# Patient Record
Sex: Female | Born: 1985 | Hispanic: No | Marital: Married | Smoking: Former smoker
Health system: Southern US, Community
[De-identification: ages and names within clinical notes are randomized; demographics above are authoritative.]

## PROBLEM LIST (undated history)

## (undated) ENCOUNTER — Inpatient Hospital Stay (HOSPITAL_COMMUNITY): Payer: Self-pay

## (undated) DIAGNOSIS — F329 Major depressive disorder, single episode, unspecified: Secondary | ICD-10-CM

## (undated) DIAGNOSIS — F909 Attention-deficit hyperactivity disorder, unspecified type: Secondary | ICD-10-CM

## (undated) DIAGNOSIS — R51 Headache: Secondary | ICD-10-CM

## (undated) DIAGNOSIS — F32A Depression, unspecified: Secondary | ICD-10-CM

## (undated) DIAGNOSIS — R519 Headache, unspecified: Secondary | ICD-10-CM

---
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
## 2008-08-29 ENCOUNTER — Ambulatory Visit (HOSPITAL_COMMUNITY): Admission: RE | Admit: 2008-08-29 | Discharge: 2008-08-29 | Payer: Self-pay | Admitting: Obstetrics and Gynecology

## 2010-10-08 LAB — CBC
MCHC: 33.8 g/dL (ref 30.0–36.0)
RBC: 4.76 MIL/uL (ref 3.87–5.11)
WBC: 7.4 10*3/uL (ref 4.0–10.5)
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
## 2013-02-26 ENCOUNTER — Emergency Department (HOSPITAL_COMMUNITY)
Admission: EM | Admit: 2013-02-26 | Discharge: 2013-02-26 | Disposition: A | Discharge status: Home / Self Care | Payer: No Typology Code available for payment source | Attending: Emergency Medicine | Admitting: Emergency Medicine

## 2013-02-26 ENCOUNTER — Encounter (HOSPITAL_COMMUNITY): Payer: Self-pay | Admitting: Nurse Practitioner

## 2013-02-26 DIAGNOSIS — O2390 Unspecified genitourinary tract infection in pregnancy, unspecified trimester: Secondary | ICD-10-CM | POA: Insufficient documentation

## 2013-02-26 DIAGNOSIS — Z8659 Personal history of other mental and behavioral disorders: Secondary | ICD-10-CM | POA: Insufficient documentation

## 2013-02-26 DIAGNOSIS — Z331 Pregnant state, incidental: Secondary | ICD-10-CM

## 2013-02-26 DIAGNOSIS — N390 Urinary tract infection, site not specified: Secondary | ICD-10-CM

## 2013-02-26 DIAGNOSIS — R51 Headache: Secondary | ICD-10-CM | POA: Insufficient documentation

## 2013-02-26 DIAGNOSIS — Z792 Long term (current) use of antibiotics: Secondary | ICD-10-CM | POA: Insufficient documentation

## 2013-02-26 DIAGNOSIS — E162 Hypoglycemia, unspecified: Secondary | ICD-10-CM | POA: Insufficient documentation

## 2013-02-26 DIAGNOSIS — Z79899 Other long term (current) drug therapy: Secondary | ICD-10-CM | POA: Insufficient documentation

## 2013-02-26 DIAGNOSIS — R404 Transient alteration of awareness: Secondary | ICD-10-CM | POA: Insufficient documentation

## 2013-02-26 DIAGNOSIS — Z87891 Personal history of nicotine dependence: Secondary | ICD-10-CM | POA: Insufficient documentation

## 2013-02-26 HISTORY — DX: Attention-deficit hyperactivity disorder, unspecified type: F90.9

## 2013-02-26 HISTORY — DX: Major depressive disorder, single episode, unspecified: F32.9

## 2013-02-26 HISTORY — DX: Depression, unspecified: F32.A

## 2013-02-26 HISTORY — DX: Headache: R51

## 2013-02-26 HISTORY — DX: Headache, unspecified: R51.9

## 2013-02-26 LAB — URINALYSIS, ROUTINE W REFLEX MICROSCOPIC
Glucose, UA: NEGATIVE mg/dL
Nitrite: NEGATIVE
Protein, ur: NEGATIVE mg/dL

## 2013-02-26 LAB — URINE MICROSCOPIC-ADD ON

## 2013-02-26 LAB — POCT I-STAT, CHEM 8
BUN: 5 mg/dL — ABNORMAL LOW (ref 6–23)
Chloride: 105 mEq/L (ref 96–112)
Creatinine, Ser: 0.7 mg/dL (ref 0.50–1.10)
Potassium: 4.2 mEq/L (ref 3.5–5.1)
Sodium: 137 mEq/L (ref 135–145)

## 2013-02-26 LAB — GLUCOSE, CAPILLARY: Glucose-Capillary: 82 mg/dL (ref 70–99)

## 2013-02-26 MED ORDER — ACETAMINOPHEN 325 MG PO TABS
650.0000 mg | ORAL_TABLET | Freq: Once | ORAL | Status: AC
  Administered 2013-02-26: 650 mg via ORAL
  Filled 2013-02-26: qty 2

## 2013-02-26 MED ORDER — CEPHALEXIN 500 MG PO CAPS: 500.0000 mg | ORAL_CAPSULE | Freq: Four times a day (QID) | ORAL | Status: DC

## 2013-02-26 NOTE — ED Notes (Signed)
cbg was 82 at 1502
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
## 2013-02-27 LAB — URINE CULTURE

## 2013-03-06 ENCOUNTER — Ambulatory Visit (INDEPENDENT_AMBULATORY_CARE_PROVIDER_SITE_OTHER): Payer: PRIVATE HEALTH INSURANCE | Admitting: Obstetrics & Gynecology

## 2013-03-06 ENCOUNTER — Encounter: Payer: Self-pay | Admitting: Obstetrics & Gynecology

## 2013-03-06 VITALS — BP 125/91 | Wt 176.2 lb

## 2013-03-06 DIAGNOSIS — Z3401 Encounter for supervision of normal first pregnancy, first trimester: Secondary | ICD-10-CM

## 2013-03-06 DIAGNOSIS — N898 Other specified noninflammatory disorders of vagina: Secondary | ICD-10-CM

## 2013-03-06 DIAGNOSIS — O99331 Smoking (tobacco) complicating pregnancy, first trimester: Secondary | ICD-10-CM | POA: Insufficient documentation

## 2013-03-06 DIAGNOSIS — O99330 Smoking (tobacco) complicating pregnancy, unspecified trimester: Secondary | ICD-10-CM

## 2013-03-06 LAB — POCT URINALYSIS DIP (DEVICE)
Protein, ur: NEGATIVE mg/dL
Specific Gravity, Urine: 1.02 (ref 1.005–1.030)
Urobilinogen, UA: 0.2 mg/dL (ref 0.0–1.0)
pH: 7.5 (ref 5.0–8.0)

## 2013-03-06 NOTE — Progress Notes (Signed)
Pt with c/o extreme fatigue.  Did not go to work yesterday because she was tired.  Wants note for work to sit down.   Past Medical History  Diagnosis Date  . ADHD (attention deficit hyperactivity disorder)   . Depression   . Headache      History reviewed. No pertinent past surgical history. No Known Allergies Current Outpatient Prescriptions on File Prior to Visit  Medication Sig Dispense Refill  . cephALEXin (KEFLEX) 500 MG capsule Take 1 capsule (500 mg total) by mouth 4 (four) times daily.  28 capsule  0  . Prenatal Vit-Fe Fumarate-FA (PRENATAL MULTIVITAMIN) TABS tablet Take 1 tablet by mouth daily at 12 noon.       No current facility-administered medications on file prior to visit.   History   Social History  . Marital Status: Single    Spouse Name: N/A    Number of Children: N/A  . Years of Education: N/A   Occupational History  . Not on file.   Social History Main Topics  . Smoking status: Current Every Day Smoker  . Smokeless tobacco: Never Used  . Alcohol Use: No  . Drug Use: No  . Sexual Activity: Yes    Birth Control/ Protection: None   Other Topics Concern  . Not on file   Social History Narrative  . No narrative on file   Positive tob prev 1 PPD now 2 cigs per day  PE: BP 125/91  Wt 176 lb 3.2 oz (79.924 kg)  BMI 28.45 kg/m2  LMP 12/17/2012 Abd; benign FHR 150's GU: EGBUS: no lesions Vagina: no blood in vault Cervix: no lesion; no mucopurulent d/c Uterus: gravid 12-14 weeks mobile Adnexa: no masses; sl tender   A/P 11 week IUP  PNL today rec tobacco cessation F/u 4 weeks D/w work in pregnancy

## 2013-03-06 NOTE — Addendum Note (Signed)
Addended by: Franchot Mimes on: 03/06/2013 11:27 AM   Modules accepted: Orders

## 2013-03-06 NOTE — Progress Notes (Signed)
Pulse: 83 1hr due at 945. Needs pap, never had one before.
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
## 2013-03-07 ENCOUNTER — Encounter: Payer: Self-pay | Admitting: General Practice

## 2013-03-07 ENCOUNTER — Other Ambulatory Visit: Payer: Self-pay | Admitting: Obstetrics & Gynecology

## 2013-03-07 DIAGNOSIS — B373 Candidiasis of vulva and vagina: Secondary | ICD-10-CM

## 2013-03-07 LAB — OBSTETRIC PANEL
Antibody Screen: NEGATIVE
Basophils Absolute: 0 10*3/uL (ref 0.0–0.1)
Basophils Relative: 0 % (ref 0–1)
Eosinophils Absolute: 0.1 10*3/uL (ref 0.0–0.7)
Hepatitis B Surface Ag: NEGATIVE
MCH: 31.2 pg (ref 26.0–34.0)
MCHC: 34.7 g/dL (ref 30.0–36.0)
Neutro Abs: 6.6 10*3/uL (ref 1.7–7.7)
Neutrophils Relative %: 76 % (ref 43–77)
RDW: 13.4 % (ref 11.5–15.5)

## 2013-03-07 LAB — GLUCOSE TOLERANCE, 1 HOUR (50G) W/O FASTING: Glucose, 1 Hour GTT: 54 mg/dL — ABNORMAL LOW (ref 70–140)

## 2013-03-07 LAB — WET PREP, GENITAL: Trich, Wet Prep: NONE SEEN

## 2013-03-07 MED ORDER — FLUCONAZOLE 150 MG PO TABS: 150.0000 mg | ORAL_TABLET | Freq: Once | ORAL | Status: DC

## 2013-03-07 NOTE — Progress Notes (Signed)
Called patient, no answer- left message that we are trying to get in touch with you, please call us back at the clinics 

## 2013-03-07 NOTE — Progress Notes (Signed)
Patient called back in to front desk and I informed her of results & medication available for pickup. Patient verbalized understanding and had no further questions

## 2013-03-08 ENCOUNTER — Encounter: Payer: Self-pay | Admitting: Obstetrics & Gynecology

## 2013-03-08 LAB — CULTURE, OB URINE: Colony Count: >=100000

## 2013-03-08 LAB — HEMOGLOBINOPATHY EVALUATION
Hemoglobin Other: 0 %
Hgb A2 Quant: 2.6 % (ref 2.2–3.2)
Hgb A: 97.4 % (ref 96.8–97.8)
Hgb S Quant: 0 %

## 2013-03-20 ENCOUNTER — Encounter (HOSPITAL_COMMUNITY): Payer: Self-pay

## 2013-03-20 ENCOUNTER — Emergency Department (HOSPITAL_COMMUNITY)
Admission: EM | Admit: 2013-03-20 | Discharge: 2013-03-20 | Disposition: A | Discharge status: Home / Self Care | Payer: No Typology Code available for payment source | Attending: Emergency Medicine | Admitting: Emergency Medicine

## 2013-03-20 DIAGNOSIS — R42 Dizziness and giddiness: Secondary | ICD-10-CM | POA: Insufficient documentation

## 2013-03-20 DIAGNOSIS — Z79899 Other long term (current) drug therapy: Secondary | ICD-10-CM | POA: Insufficient documentation

## 2013-03-20 DIAGNOSIS — G43909 Migraine, unspecified, not intractable, without status migrainosus: Secondary | ICD-10-CM | POA: Insufficient documentation

## 2013-03-20 DIAGNOSIS — Z8659 Personal history of other mental and behavioral disorders: Secondary | ICD-10-CM | POA: Insufficient documentation

## 2013-03-20 DIAGNOSIS — Z792 Long term (current) use of antibiotics: Secondary | ICD-10-CM | POA: Insufficient documentation

## 2013-03-20 DIAGNOSIS — F17200 Nicotine dependence, unspecified, uncomplicated: Secondary | ICD-10-CM | POA: Insufficient documentation

## 2013-03-20 LAB — URINALYSIS, ROUTINE W REFLEX MICROSCOPIC
Glucose, UA: NEGATIVE mg/dL
Ketones, ur: NEGATIVE mg/dL
Leukocytes, UA: NEGATIVE
Nitrite: NEGATIVE
Protein, ur: NEGATIVE mg/dL
Urobilinogen, UA: 0.2 mg/dL (ref 0.0–1.0)

## 2013-03-20 LAB — CBC
HCT: 37.2 % (ref 36.0–46.0)
Hemoglobin: 13.2 g/dL (ref 12.0–15.0)
MCV: 87.5 fL (ref 78.0–100.0)
Platelets: 275 10*3/uL (ref 150–400)
RBC: 4.25 MIL/uL (ref 3.87–5.11)
WBC: 8.5 10*3/uL (ref 4.0–10.5)

## 2013-03-20 LAB — BASIC METABOLIC PANEL
CO2: 24 mEq/L (ref 19–32)
Calcium: 9.4 mg/dL (ref 8.4–10.5)
Chloride: 102 mEq/L (ref 96–112)
Creatinine, Ser: 0.55 mg/dL (ref 0.50–1.10)
GFR calc Af Amer: >90 mL/min (ref >90)
GFR calc non Af Amer: >90 mL/min (ref >90)
Potassium: 3.7 mEq/L (ref 3.5–5.1)
Sodium: 135 mEq/L (ref 135–145)

## 2013-03-20 LAB — GLUCOSE, CAPILLARY: Glucose-Capillary: 76 mg/dL (ref 70–99)

## 2013-03-20 MED ORDER — SODIUM CHLORIDE 0.9 % IV BOLUS (SEPSIS)
1000.0000 mL | Freq: Once | INTRAVENOUS | Status: AC
  Administered 2013-03-20: 1000 mL via INTRAVENOUS

## 2013-03-20 MED ORDER — ACETAMINOPHEN 500 MG PO TABS
1000.0000 mg | ORAL_TABLET | Freq: Once | ORAL | Status: AC
  Administered 2013-03-20: 1000 mg via ORAL
  Filled 2013-03-20: qty 2

## 2013-03-20 MED ORDER — ONDANSETRON 4 MG PO TBDP
4.0000 mg | ORAL_TABLET | Freq: Once | ORAL | Status: AC
  Administered 2013-03-20: 4 mg via ORAL
  Filled 2013-03-20: qty 1

## 2013-03-20 NOTE — ED Notes (Signed)
Pt reports woke up around 0630 this morning with headache and dizziness.  Denies any n/v.

## 2013-03-20 NOTE — Discharge Instructions (Signed)
Migraine Headache °A migraine headache is an intense, throbbing pain on one or both sides of your head. A migraine can last for 30 minutes to several hours. °CAUSES  °The exact cause of a migraine headache is not always known. However, a migraine may be caused when nerves in the brain become irritated and release chemicals that cause inflammation. This causes pain. °SYMPTOMS °· Pain on one or both sides of your head. °· Pulsating or throbbing pain. °· Severe pain that prevents daily activities. °· Pain that is aggravated by any physical activity. °· Nausea, vomiting, or both. °· Dizziness. °· Pain with exposure to bright lights, loud noises, or activity. °· General sensitivity to bright lights, loud noises, or smells. °Before you get a migraine, you may get warning signs that a migraine is coming (aura). An aura may include: °· Seeing flashing lights. °· Seeing bright spots, halos, or zig-zag lines. °· Having tunnel vision or blurred vision. °· Having feelings of numbness or tingling. °· Having trouble talking. °· Having muscle weakness. °MIGRAINE TRIGGERS °· Alcohol. °· Smoking. °· Stress. °· Menstruation. °· Aged cheeses. °· Foods or drinks that contain nitrates, glutamate, aspartame, or tyramine. °· Lack of sleep. °· Chocolate. °· Caffeine. °· Hunger. °· Physical exertion. °· Fatigue. °· Medicines used to treat chest pain (nitroglycerine), birth control pills, estrogen, and some blood pressure medicines. °DIAGNOSIS  °A migraine headache is often diagnosed based on: °· Symptoms. °· Physical examination. °· A CT scan or MRI of your head. °TREATMENT °Medicines may be given for pain and nausea. Medicines can also be given to help prevent recurrent migraines.  °HOME CARE INSTRUCTIONS °· Only take over-the-counter or prescription medicines for pain or discomfort as directed by your caregiver. The use of long-term narcotics is not recommended. °· Lie down in a dark, quiet room when you have a migraine. °· Keep a journal  to find out what may trigger your migraine headaches. For example, write down: °· What you eat and drink. °· How much sleep you get. °· Any change to your diet or medicines. °· Limit alcohol consumption. °· Quit smoking if you smoke. °· Get 7 to 9 hours of sleep, or as recommended by your caregiver. °· Limit stress. °· Keep lights dim if bright lights bother you and make your migraines worse. °SEEK IMMEDIATE MEDICAL CARE IF:  °· Your migraine becomes severe. °· You have a fever. °· You have a stiff neck. °· You have vision loss. °· You have muscular weakness or loss of muscle control. °· You start losing your balance or have trouble walking. °· You feel faint or pass out. °· You have severe symptoms that are different from your first symptoms. °MAKE SURE YOU:  °· Understand these instructions. °· Will watch your condition. °· Will get help right away if you are not doing well or get worse. °Document Released: 06/14/2005 Document Revised: 09/06/2011 Document Reviewed: 06/04/2011 °Exit[employer]® Patient Information ©2014 [employer]. ° ° °RESOURCE GUIDE ° °Chronic Pain Problems: °Contact [facility]  [phone] °Patients need to be referred by their primary care doctor. ° °Insufficient Money for Medicine: °Contact [employer]:  call ([phone] 892-1162 ° °No Primary Care Doctor: °- Call [facility]  [phone] - can help you locate a primary care doctor that  accepts your insurance, provides certain services, etc. °- Physician Referral Service- 1-800-533-3463 ° °Agencies that provide inexpensive medical care: °- [facility]  [phone] °- [facility]  [phone] °- [facility]  [phone] °- [facility]  [phone] °- [facility]  [phone] °- [facility]  [phone] ° °Medicaid-accepting [place] Providers: °- [facility]- [address] ° [phone], Mon-Fri 9am-7pm, Sat 9am-1pm °- [facility]- [address], [address] ° [phone] °- [facility]- [address], [address] ° [phone] °- [facility]- [address] [place] [address] ° [phone] °- [facility]- [address], [address], [phone] ° Only accepts [place] Access Medicaid patients after they have their name  applied to their card ° °Self Pay (no insurance) in [place]: °- Sickle Cell Patients - [facility] ° [address], [phone] °- [facility]- [address] ° [phone] °      -     [facility]- [address] °      -     [facility]- see information above (Speak to Pam H if you do not have insurance) °      -  [facility] [place]- [address],  [phone] °      -  [facility]- [address] [place] [address], [phone] °      -  Dr Osei-Bonsu-  [address], [address], [place], [phone] °      -  [facility] - [address], [phone] °      -  [facility]- [address] [place] [address], [phone], also [address]  [address], [phone] °      -     [facility]- [address], [phone], 1st & 3rd Saturday °        every month, 10am-1pm ° -     [facility] °  [address], [place]. °  Phone:  [phone], Fax:  [phone]. Hours of Operation:  9 am - 6 pm, M-F. ° -     [facility] °  [address], [address], [place] °  Phone: [phone], Fax: [phone]. Hours of Operation:  8:30 am - 5:30 pm, M-F. ° ° ° °Dental Assistance °If unable to pay or uninsured, contact:  [place] [facility] to become qualified for the adult dental clinic. ° °Patients with Medicaid: [facility] °[address], [phone] °[address], [phone] ° °If unable to pay, or uninsured, contact [place] [facility] ([phone] in [place], [phone] in [place]) to become qualified  for the adult dental clinic ° °[facility] °[address] °[place], [place] [address] °([phone] °[email or web address] ° °Other Low-Cost Community Dental Services: °- [facility]- [address], [place], [place], [address], [phone], [phone], 2nd and 4th Thursday of the month at 6:30am.  10 clients each day by appointment, can sometimes see walk-in patients if someone does not show for an appointment. °- [facility]- [address], [place], [place], [address], [phone] °- [facility]- [address], [place], [place], [address], [phone] °- [facility]- [phone] °- [facility]- [phone] °- [facility]- [phone] ° ° ° ° ° °

## 2013-03-20 NOTE — ED Provider Notes (Signed)
This chart was scribed for Megan Maw Maryclaire Stoecker, DO by Dorothey Baseman, ED Scribe. This patient was seen in room APA05/APA05 and the patient's care was started at 8:50 AM.   TIME SEEN: 8:50 AM  CHIEF COMPLAINT: dizziness, headache  HPI:  HPI Comments: Megan Reese is a 27 y.o. Female G1P0 with a history of migraines who presents to the Emergency Department complaining of an intermittent, throbbing migraine to the center of the forehead onset last night and this morning with associated lightheadedness. She states that this migraine feels like her past episodes, but that it is more severe. She reports that the headache is exacerbated with photophobia, loud sounds, and anxiety. She reports her headache improved with Tylenol last night but she did not take any today. She denies fever, nausea, vomiting, numbness, weakness, or paresthesias, vaginal bleeding, or vaginal discharge. No bloody stools or melena. She reports taking Tylenol last night with mild, temporary relief. Patient is currently [redacted] weeks pregnant.    ROS: See HPI Constitutional: no fever  Eyes: no drainage  ENT: no runny nose   Cardiovascular:  no chest pain  Resp: no SOB  GI: no vomiting GU: no dysuria Integumentary: no rash  Allergy: no hives  Musculoskeletal: no leg swelling  Neurological: no slurred speech ROS otherwise negative  PAST MEDICAL HISTORY/PAST SURGICAL HISTORY:  Past Medical History  Diagnosis Date  . ADHD (attention deficit hyperactivity disorder)   . Depression   . Headache     MEDICATIONS:  Prior to Admission medications   Medication Sig Start Date End Date Taking? Authorizing Provider  cephALEXin (KEFLEX) 500 MG capsule Take 1 capsule (500 mg total) by mouth 4 (four) times daily. 02/26/13   Flint Melter, MD  fluconazole (DIFLUCAN) 150 MG tablet Take 1 tablet (150 mg total) by mouth once. 03/07/13   Willodean Rosenthal, MD  Prenatal Vit-Fe Fumarate-FA (PRENATAL MULTIVITAMIN) TABS tablet Take 1 tablet by  mouth daily at 12 noon.    Historical Provider, MD    ALLERGIES:  No Known Allergies  SOCIAL HISTORY:  History  Substance Use Topics  . Smoking status: Current Every Day Smoker  . Smokeless tobacco: Never Used  . Alcohol Use: No    FAMILY HISTORY: Family History  Problem Relation Age of Onset  . Diabetes Mother     EXAM:  Triage Vitals: BP 114/88  Pulse 96  Temp(Src) 98.3 F (36.8 C) (Oral)  SpO2 99%  LMP 12/17/2012  CONSTITUTIONAL: Alert and oriented and responds appropriately to questions. Well-appearing; well-nourished HEAD: Normocephalic EYES: Conjunctivae clear, PERRL ENT: normal nose; no rhinorrhea; moist mucous membranes; pharynx without lesions noted NECK: Supple, no meningismus, no LAD  CARD: RRR; S1 and S2 appreciated; no murmurs, no clicks, no rubs, no gallops RESP: Normal chest excursion without splinting or tachypnea; breath sounds clear and equal bilaterally; no wheezes, no rhonchi, no rales,  ABD/GI: Normal bowel sounds; non-distended; soft, non-tender, no rebound, no guarding BACK:  The back appears normal and is non-tender to palpation, there is no CVA tenderness EXT: Normal ROM in all joints; non-tender to palpation; no edema; normal capillary refill; no cyanosis    SKIN: Normal color for age and race; warm NEURO: Moves all extremities equally, strength is 5/5 to all 4 extremities, sensation to light touch intact diffusely. Cranial nerves 2-12 intact. No dysmetria to finger to nose testing bilaterally. PSYCH: The patient's mood and manner are appropriate. Grooming and personal hygiene are appropriate.  MEDICAL DECISION MAKING: 8:52AM- patient with typical migraine headache.  I am not concerned for intracranial hemorrhage, infarct, meningitis. Will order IV fluids and labs. Will order Tylenol and Zofran. Discussed treatment plan with patient at bedside and patient verbalized agreement.    ED PROGRESS: Patient reports feeling much better and her headache  is now gone. Labs, urinalysis unremarkable. We'll discharge home. Given return precautions. Given supportive care instructions. Patient verbalizes understanding is comfortable plan.   I personally performed the services described in this documentation, which was scribed in my presence. The recorded information has been reviewed and is accurate.   Megan Maw Megan Mairena, DO 03/20/13 1032

## 2013-03-21 NOTE — ED Notes (Signed)
Pt called and stated she was to sick to go to work today. Requested a work note for today and to have an Rx for nausea. EDP aware. Pt to have work note for today and an RX called in for Zofran 8 mg ODT 10 tabs

## 2013-03-23 ENCOUNTER — Telehealth: Payer: Self-pay | Admitting: *Deleted

## 2013-03-23 DIAGNOSIS — B373 Candidiasis of vulva and vagina: Secondary | ICD-10-CM

## 2013-03-23 MED ORDER — FLUCONAZOLE 150 MG PO TABS: 150.0000 mg | ORAL_TABLET | Freq: Once | ORAL | Status: DC

## 2013-03-23 NOTE — Telephone Encounter (Signed)
Megan Reese called and left a message stating she was seen recently and had yeast and took the medicine prescribed and it got better, but has come back. Wants to know if she can get a refill or does she need to be seen. Per chart wet prep done 03/05/13 and showed moderate yeast, given one dose diflucan.  Called Megan Reese and she states she has the same symptoms, white discharge, itching and burning. I informed her I can send in one more refill, but if that does not relieve her symptoms she needs to come in for eval as it could be something else. Also noted patient has next ob visit early October.  Patient voices understanding

## 2013-03-28 ENCOUNTER — Encounter: Payer: PRIVATE HEALTH INSURANCE | Admitting: Advanced Practice Midwife

## 2013-04-03 ENCOUNTER — Encounter: Payer: Self-pay | Admitting: Family Medicine

## 2013-04-03 ENCOUNTER — Ambulatory Visit (INDEPENDENT_AMBULATORY_CARE_PROVIDER_SITE_OTHER): Payer: No Typology Code available for payment source | Admitting: Family Medicine

## 2013-04-03 VITALS — BP 119/77 | Temp 97.7°F

## 2013-04-03 DIAGNOSIS — Z3480 Encounter for supervision of other normal pregnancy, unspecified trimester: Secondary | ICD-10-CM

## 2013-04-03 DIAGNOSIS — Z23 Encounter for immunization: Secondary | ICD-10-CM

## 2013-04-03 DIAGNOSIS — O99331 Smoking (tobacco) complicating pregnancy, first trimester: Secondary | ICD-10-CM

## 2013-04-03 DIAGNOSIS — Z3492 Encounter for supervision of normal pregnancy, unspecified, second trimester: Secondary | ICD-10-CM

## 2013-04-03 DIAGNOSIS — O99330 Smoking (tobacco) complicating pregnancy, unspecified trimester: Secondary | ICD-10-CM

## 2013-04-03 LAB — POCT URINALYSIS DIP (DEVICE)
Bilirubin Urine: NEGATIVE
Glucose, UA: NEGATIVE mg/dL
Ketones, ur: NEGATIVE mg/dL

## 2013-04-03 NOTE — Addendum Note (Signed)
Addended by: Jill Side on: 04/03/2013 03:18 PM   Modules accepted: Orders

## 2013-04-03 NOTE — Addendum Note (Signed)
Addended by: Franchot Mimes on: 04/03/2013 03:10 PM   Modules accepted: Orders

## 2013-04-03 NOTE — Patient Instructions (Addendum)
Pregnancy - Second Trimester The second trimester of pregnancy (3 to 6 months) is a period of rapid growth for you and your baby. At the end of the sixth month, your baby is about 9 inches long and weighs 1 1/2 pounds. You will begin to feel the baby move between 18 and 20 weeks of the pregnancy. This is called quickening. Weight gain is faster. A clear fluid (colostrum) may leak out of your breasts. You may feel small contractions of the womb (uterus). This is known as false labor or Braxton-Hicks contractions. This is like a practice for labor when the baby is ready to be born. Usually, the problems with morning sickness have usually passed by the end of your first trimester. Some women develop small dark blotches (called cholasma, mask of pregnancy) on their face that usually goes away after the baby is born. Exposure to the sun makes the blotches worse. Acne may also develop in some pregnant women and pregnant women who have acne, may find that it goes away. PRENATAL EXAMS  Blood work may continue to be done during prenatal exams. These tests are done to check on your health and the probable health of your baby. Blood work is used to follow your blood levels (hemoglobin). Anemia (low hemoglobin) is common during pregnancy. Iron and vitamins are given to help prevent this. You will also be checked for diabetes between 24 and 28 weeks of the pregnancy. Some of the previous blood tests may be repeated.  The size of the uterus is measured during each visit. This is to make sure that the baby is continuing to grow properly according to the dates of the pregnancy.  Your blood pressure is checked every prenatal visit. This is to make sure you are not getting toxemia.  Your urine is checked to make sure you do not have an infection, diabetes or protein in the urine.  Your weight is checked often to make sure gains are happening at the suggested rate. This is to ensure that both you and your baby are growing  normally.  Sometimes, an ultrasound is performed to confirm the proper growth and development of the baby. This is a test which bounces harmless sound waves off the baby so your caregiver can more accurately determine due dates. Sometimes, a test is done on the amniotic fluid surrounding the baby. This test is called an amniocentesis. The amniotic fluid is obtained by sticking a needle into the belly (abdomen). This is done to check the chromosomes in instances where there is a concern about possible genetic problems with the baby. It is also sometimes done near the end of pregnancy if an early delivery is required. In this case, it is done to help make sure the baby's lungs are mature enough for the baby to live outside of the womb. CHANGES OCCURING IN THE SECOND TRIMESTER OF PREGNANCY Your body goes through many changes during pregnancy. They vary from person to person. Talk to your caregiver about changes you notice that you are concerned about.  During the second trimester, you will likely have an increase in your appetite. It is normal to have cravings for certain foods. This varies from person to person and pregnancy to pregnancy.  Your lower abdomen will begin to bulge.  You may have to urinate more often because the uterus and baby are pressing on your bladder. It is also common to get more bladder infections during pregnancy. You can help this by drinking lots of fluids  and emptying your bladder before and after intercourse.  You may begin to get stretch marks on your hips, abdomen, and breasts. These are normal changes in the body during pregnancy. There are no exercises or medicines to take that prevent this change.  You may begin to develop swollen and bulging veins (varicose veins) in your legs. Wearing support hose, elevating your feet for 15 minutes, 3 to 4 times a day and limiting salt in your diet helps lessen the problem.  Heartburn may develop as the uterus grows and pushes up  against the stomach. Antacids recommended by your caregiver helps with this problem. Also, eating smaller meals 4 to 5 times a day helps.  Constipation can be treated with a stool softener or adding bulk to your diet. Drinking lots of fluids, and eating vegetables, fruits, and whole grains are helpful.  Exercising is also helpful. If you have been very active up until your pregnancy, most of these activities can be continued during your pregnancy. If you have been less active, it is helpful to start an exercise program such as walking.  Hemorrhoids may develop at the end of the second trimester. Warm sitz baths and hemorrhoid cream recommended by your caregiver helps hemorrhoid problems.  Backaches may develop during this time of your pregnancy. Avoid heavy lifting, wear low heal shoes, and practice good posture to help with backache problems.  Some pregnant women develop tingling and numbness of their hand and fingers because of swelling and tightening of ligaments in the wrist (carpel tunnel syndrome). This goes away after the baby is born.  As your breasts enlarge, you may have to get a bigger bra. Get a comfortable, cotton, support bra. Do not get a nursing bra until the last month of the pregnancy if you will be nursing the baby.  You may get a dark line from your belly button to the pubic area called the linea nigra.  You may develop rosy cheeks because of increase blood flow to the face.  You may develop spider looking lines of the face, neck, arms, and chest. These go away after the baby is born. HOME CARE INSTRUCTIONS   It is extremely important to avoid all smoking, herbs, alcohol, and unprescribed drugs during your pregnancy. These chemicals affect the formation and growth of the baby. Avoid these chemicals throughout the pregnancy to ensure the delivery of a healthy infant.  Most of your home care instructions are the same as suggested for the first trimester of your pregnancy.  Keep your caregiver's appointments. Follow your caregiver's instructions regarding medicine use, exercise, and diet.  During pregnancy, you are providing food for you and your baby. Continue to eat regular, well-balanced meals. Choose foods such as meat, fish, milk and other low fat dairy products, vegetables, fruits, and whole-grain breads and cereals. Your caregiver will tell you of the ideal weight gain.  A physical sexual relationship may be continued up until near the end of pregnancy if there are no other problems. Problems could include early (premature) leaking of amniotic fluid from the membranes, vaginal bleeding, abdominal pain, or other medical or pregnancy problems.  Exercise regularly if there are no restrictions. Check with your caregiver if you are unsure of the safety of some of your exercises. The greatest weight gain will occur in the last 2 trimesters of pregnancy. Exercise will help you:  Control your weight.  Get you in shape for labor and delivery.  Lose weight after you have the baby.  Wear  a good support or jogging bra for breast tenderness during pregnancy. This may help if worn during sleep. Pads or tissues may be used in the bra if you are leaking colostrum.  Do not use hot tubs, steam rooms or saunas throughout the pregnancy.  Wear your seat belt at all times when driving. This protects you and your baby if you are in an accident.  Avoid raw meat, uncooked cheese, cat litter boxes, and soil used by cats. These carry germs that can cause birth defects in the baby.  The second trimester is also a good time to visit your dentist for your dental health if this has not been done yet. Getting your teeth cleaned is okay. Use a soft toothbrush. Brush gently during pregnancy.  It is easier to leak urine during pregnancy. Tightening up and strengthening the pelvic muscles will help with this problem. Practice stopping your urination while you are going to the bathroom.  These are the same muscles you need to strengthen. It is also the muscles you would use as if you were trying to stop from passing gas. You can practice tightening these muscles up 10 times a set and repeating this about 3 times per day. Once you know what muscles to tighten up, do not perform these exercises during urination. It is more likely to contribute to an infection by backing up the urine.  Ask for help if you have financial, counseling, or nutritional needs during pregnancy. Your caregiver will be able to offer counseling for these needs as well as refer you for other special needs.  Your skin may become oily. If so, wash your face with mild soap, use non-greasy moisturizer and oil or cream based makeup. MEDICINES AND DRUG USE IN PREGNANCY  Take prenatal vitamins as directed. The vitamin should contain 1 milligram of folic acid. Keep all vitamins out of reach of children. Only a couple vitamins or tablets containing iron may be fatal to a baby or young child when ingested.  Avoid use of all medicines, including herbs, over-the-counter medicines, not prescribed or suggested by your caregiver. Only take over-the-counter or prescription medicines for pain, discomfort, or fever as directed by your caregiver. Do not use aspirin.  Let your caregiver also know about herbs you may be using.  Alcohol is related to a number of birth defects. This includes fetal alcohol syndrome. All alcohol, in any form, should be avoided completely. Smoking will cause low birth rate and premature babies.  Street or illegal drugs are very harmful to the baby. They are absolutely forbidden. A baby born to an addicted mother will be addicted at birth. The baby will go through the same withdrawal an adult does. SEEK MEDICAL CARE IF:  You have any concerns or worries during your pregnancy. It is better to call with your questions if you feel they cannot wait, rather than worry about them. SEEK IMMEDIATE MEDICAL CARE  IF:   An unexplained oral temperature above 102 F (38.9 C) develops, or as your caregiver suggests.  You have leaking of fluid from the vagina (birth canal). If leaking membranes are suspected, take your temperature and tell your caregiver of this when you call.  There is vaginal spotting, bleeding, or passing clots. Tell your caregiver of the amount and how many pads are used. Light spotting in pregnancy is common, especially following intercourse.  You develop a bad smelling vaginal discharge with a change in the color from clear to white.  You continue to feel  sick to your stomach (nauseated) and have no relief from remedies suggested. You vomit blood or coffee ground-like materials.  You lose more than 2 pounds of weight or gain more than 2 pounds of weight over 1 week, or as suggested by your caregiver.  You notice swelling of your face, hands, feet, or legs.  You get exposed to [place] measles and have never had them.  You are exposed to fifth disease or chickenpox.  You develop belly (abdominal) pain. Round ligament discomfort is a common non-cancerous (benign) cause of abdominal pain in pregnancy. Your caregiver still must evaluate you.  You develop a bad headache that does not go away.  You develop fever, diarrhea, pain with urination, or shortness of breath.  You develop visual problems, blurry, or double vision.  You fall or are in a car accident or any kind of trauma.  There is mental or physical violence at home. Document Released: 06/08/2001 Document Revised: 03/08/2012 Document Reviewed: 12/11/2008 [facility] Patient Information 2014 [place], [place].  Pregnancy - Second Trimester The second trimester of pregnancy (3 to 6 months) is a period of rapid growth for you and your baby. At the end of the sixth month, your baby is about 9 inches long and weighs 1 1/2 pounds. You will begin to feel the baby move between 18 and 20 weeks of the pregnancy. This is called  quickening. Weight gain is faster. A clear fluid (colostrum) may leak out of your breasts. You may feel small contractions of the womb (uterus). This is known as false labor or Braxton-Hicks contractions. This is like a practice for labor when the baby is ready to be born. Usually, the problems with morning sickness have usually passed by the end of your first trimester. Some women develop small dark blotches (called cholasma, mask of pregnancy) on their face that usually goes away after the baby is born. Exposure to the sun makes the blotches worse. Acne may also develop in some pregnant women and pregnant women who have acne, may find that it goes away. PRENATAL EXAMS  Blood work may continue to be done during prenatal exams. These tests are done to check on your health and the probable health of your baby. Blood work is used to follow your blood levels (hemoglobin). Anemia (low hemoglobin) is common during pregnancy. Iron and vitamins are given to help prevent this. You will also be checked for diabetes between 24 and 28 weeks of the pregnancy. Some of the previous blood tests may be repeated.  The size of the uterus is measured during each visit. This is to make sure that the baby is continuing to grow properly according to the dates of the pregnancy.  Your blood pressure is checked every prenatal visit. This is to make sure you are not getting toxemia.  Your urine is checked to make sure you do not have an infection, diabetes or protein in the urine.  Your weight is checked often to make sure gains are happening at the suggested rate. This is to ensure that both you and your baby are growing normally.  Sometimes, an ultrasound is performed to confirm the proper growth and development of the baby. This is a test which bounces harmless sound waves off the baby so your caregiver can more accurately determine due dates. Sometimes, a test is done on the amniotic fluid surrounding the baby. This test is  called an amniocentesis. The amniotic fluid is obtained by sticking a needle into the belly (abdomen). This  is done to check the chromosomes in instances where there is a concern about possible genetic problems with the baby. It is also sometimes done near the end of pregnancy if an early delivery is required. In this case, it is done to help make sure the baby's lungs are mature enough for the baby to live outside of the womb. CHANGES OCCURING IN THE SECOND TRIMESTER OF PREGNANCY Your body goes through many changes during pregnancy. They vary from person to person. Talk to your caregiver about changes you notice that you are concerned about.  During the second trimester, you will likely have an increase in your appetite. It is normal to have cravings for certain foods. This varies from person to person and pregnancy to pregnancy.  Your lower abdomen will begin to bulge.  You may have to urinate more often because the uterus and baby are pressing on your bladder. It is also common to get more bladder infections during pregnancy. You can help this by drinking lots of fluids and emptying your bladder before and after intercourse.  You may begin to get stretch marks on your hips, abdomen, and breasts. These are normal changes in the body during pregnancy. There are no exercises or medicines to take that prevent this change.  You may begin to develop swollen and bulging veins (varicose veins) in your legs. Wearing support hose, elevating your feet for 15 minutes, 3 to 4 times a day and limiting salt in your diet helps lessen the problem.  Heartburn may develop as the uterus grows and pushes up against the stomach. Antacids recommended by your caregiver helps with this problem. Also, eating smaller meals 4 to 5 times a day helps.  Constipation can be treated with a stool softener or adding bulk to your diet. Drinking lots of fluids, and eating vegetables, fruits, and whole grains are  helpful.  Exercising is also helpful. If you have been very active up until your pregnancy, most of these activities can be continued during your pregnancy. If you have been less active, it is helpful to start an exercise program such as walking.  Hemorrhoids may develop at the end of the second trimester. Warm sitz baths and hemorrhoid cream recommended by your caregiver helps hemorrhoid problems.  Backaches may develop during this time of your pregnancy. Avoid heavy lifting, wear low heal shoes, and practice good posture to help with backache problems.  Some pregnant women develop tingling and numbness of their hand and fingers because of swelling and tightening of ligaments in the wrist (carpel tunnel syndrome). This goes away after the baby is born.  As your breasts enlarge, you may have to get a bigger bra. Get a comfortable, cotton, support bra. Do not get a nursing bra until the last month of the pregnancy if you will be nursing the baby.  You may get a dark line from your belly button to the pubic area called the linea nigra.  You may develop rosy cheeks because of increase blood flow to the face.  You may develop spider looking lines of the face, neck, arms, and chest. These go away after the baby is born. HOME CARE INSTRUCTIONS   It is extremely important to avoid all smoking, herbs, alcohol, and unprescribed drugs during your pregnancy. These chemicals affect the formation and growth of the baby. Avoid these chemicals throughout the pregnancy to ensure the delivery of a healthy infant.  Most of your home care instructions are the same as suggested for the  first trimester of your pregnancy. Keep your caregiver's appointments. Follow your caregiver's instructions regarding medicine use, exercise, and diet.  During pregnancy, you are providing food for you and your baby. Continue to eat regular, well-balanced meals. Choose foods such as meat, fish, milk and other low fat dairy  products, vegetables, fruits, and whole-grain breads and cereals. Your caregiver will tell you of the ideal weight gain.  A physical sexual relationship may be continued up until near the end of pregnancy if there are no other problems. Problems could include early (premature) leaking of amniotic fluid from the membranes, vaginal bleeding, abdominal pain, or other medical or pregnancy problems.  Exercise regularly if there are no restrictions. Check with your caregiver if you are unsure of the safety of some of your exercises. The greatest weight gain will occur in the last 2 trimesters of pregnancy. Exercise will help you:  Control your weight.  Get you in shape for labor and delivery.  Lose weight after you have the baby.  Wear a good support or jogging bra for breast tenderness during pregnancy. This may help if worn during sleep. Pads or tissues may be used in the bra if you are leaking colostrum.  Do not use hot tubs, steam rooms or saunas throughout the pregnancy.  Wear your seat belt at all times when driving. This protects you and your baby if you are in an accident.  Avoid raw meat, uncooked cheese, cat litter boxes, and soil used by cats. These carry germs that can cause birth defects in the baby.  The second trimester is also a good time to visit your dentist for your dental health if this has not been done yet. Getting your teeth cleaned is okay. Use a soft toothbrush. Brush gently during pregnancy.  It is easier to leak urine during pregnancy. Tightening up and strengthening the pelvic muscles will help with this problem. Practice stopping your urination while you are going to the bathroom. These are the same muscles you need to strengthen. It is also the muscles you would use as if you were trying to stop from passing gas. You can practice tightening these muscles up 10 times a set and repeating this about 3 times per day. Once you know what muscles to tighten up, do not perform  these exercises during urination. It is more likely to contribute to an infection by backing up the urine.  Ask for help if you have financial, counseling, or nutritional needs during pregnancy. Your caregiver will be able to offer counseling for these needs as well as refer you for other special needs.  Your skin may become oily. If so, wash your face with mild soap, use non-greasy moisturizer and oil or cream based makeup. MEDICINES AND DRUG USE IN PREGNANCY  Take prenatal vitamins as directed. The vitamin should contain 1 milligram of folic acid. Keep all vitamins out of reach of children. Only a couple vitamins or tablets containing iron may be fatal to a baby or young child when ingested.  Avoid use of all medicines, including herbs, over-the-counter medicines, not prescribed or suggested by your caregiver. Only take over-the-counter or prescription medicines for pain, discomfort, or fever as directed by your caregiver. Do not use aspirin.  Let your caregiver also know about herbs you may be using.  Alcohol is related to a number of birth defects. This includes fetal alcohol syndrome. All alcohol, in any form, should be avoided completely. Smoking will cause low birth rate and premature babies.  Street or illegal drugs are very harmful to the baby. They are absolutely forbidden. A baby born to an addicted mother will be addicted at birth. The baby will go through the same withdrawal an adult does. SEEK MEDICAL CARE IF:  You have any concerns or worries during your pregnancy. It is better to call with your questions if you feel they cannot wait, rather than worry about them. SEEK IMMEDIATE MEDICAL CARE IF:   An unexplained oral temperature above 102 F (38.9 C) develops, or as your caregiver suggests.  You have leaking of fluid from the vagina (birth canal). If leaking membranes are suspected, take your temperature and tell your caregiver of this when you call.  There is vaginal  spotting, bleeding, or passing clots. Tell your caregiver of the amount and how many pads are used. Light spotting in pregnancy is common, especially following intercourse.  You develop a bad smelling vaginal discharge with a change in the color from clear to white.  You continue to feel sick to your stomach (nauseated) and have no relief from remedies suggested. You vomit blood or coffee ground-like materials.  You lose more than 2 pounds of weight or gain more than 2 pounds of weight over 1 week, or as suggested by your caregiver.  You notice swelling of your face, hands, feet, or legs.  You get exposed to [place] measles and have never had them.  You are exposed to fifth disease or chickenpox.  You develop belly (abdominal) pain. Round ligament discomfort is a common non-cancerous (benign) cause of abdominal pain in pregnancy. Your caregiver still must evaluate you.  You develop a bad headache that does not go away.  You develop fever, diarrhea, pain with urination, or shortness of breath.  You develop visual problems, blurry, or double vision.  You fall or are in a car accident or any kind of trauma.  There is mental or physical violence at home. Document Released: 06/08/2001 Document Revised: 03/08/2012 Document Reviewed: 12/11/2008 [facility] Patient Information 2014 [place], [place]. Place 10-18 weeks prenatal visit patient instructions here.

## 2013-04-03 NOTE — Progress Notes (Signed)
P = 65   Pt requests [place] today.  Pt reports no further problems from vaginal yeast since last Rx.  Pt states that she has been nauseous and is not taking her PNV's.

## 2013-04-03 NOTE — Progress Notes (Signed)
   Subjective:    Mahsa Maple is a 27 y.o. female being seen today for her obstetrical visit. She is at [redacted]w[redacted]d gestation. Patient reports no bleeding, no contractions, no cramping and no leaking. Fetal movement: normal.  Menstrual History: OB History   Grav Para Term Preterm Abortions TAB SAB Ect Mult Living   1                Patient's last menstrual period was 12/17/2012.    The following portions of the patient's history were reviewed and updated as appropriate: allergies, current medications, past family history, past medical history, past social history, past surgical history and problem list.  Review of Systems Pertinent items are noted in HPI.   Objective:     BP 119/77  Temp(Src) 97.7 F (36.5 C)  LMP 12/17/2012 Uterine Size: size equals dates    FHT: 150s  Assessment:   Adamary Handley is a 27 y.o. G1P0 at [redacted]w[redacted]d by LMP presents for ROB  Discussed with Patient:  - ordered genetics screen (Quad screen/anatomy scan) - Patient plans on breast  feeding. - Routine precautions discussed (depression, infection s/s).   Patient provided with all pertinent phone numbers for emergencies. - RTC for any VB, regular, painful cramps/ctxs occurring at a rate of >2/10 min, fever (100.5 or higher), n/v/d, any pain that is unresolving or worsening. - RTC in 4 weeks for next appt.  Problems: Patient Active Problem List   Diagnosis Date Noted  . 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 20 week anatomy scan ordered.  Patient to schedule. 2. Quad screen  [ ]  Vaccines: Flu: 7Oct Tdap:  [ ]  BCM: undecided  Edu: [ x] PTL precautions; [ ]  BF class; [ ]  childbirth class; [ ]   BF counseling

## 2013-04-04 LAB — CULTURE, OB URINE

## 2013-04-08 NOTE — Addendum Note (Signed)
Addended by: Minta Balsam on: 04/08/2013 05:04 PM   Modules accepted: Orders

## 2013-05-01 ENCOUNTER — Ambulatory Visit (INDEPENDENT_AMBULATORY_CARE_PROVIDER_SITE_OTHER): Payer: No Typology Code available for payment source | Admitting: Family Medicine

## 2013-05-01 ENCOUNTER — Other Ambulatory Visit: Payer: Self-pay | Admitting: Family Medicine

## 2013-05-01 ENCOUNTER — Encounter: Payer: Self-pay | Admitting: Family Medicine

## 2013-05-01 ENCOUNTER — Ambulatory Visit (HOSPITAL_COMMUNITY)
Admission: RE | Admit: 2013-05-01 | Discharge: 2013-05-01 | Disposition: A | Discharge status: Home / Self Care | Payer: No Typology Code available for payment source | Source: Ambulatory Visit | Attending: Family Medicine | Admitting: Family Medicine

## 2013-05-01 ENCOUNTER — Encounter (HOSPITAL_COMMUNITY): Payer: Self-pay

## 2013-05-01 VITALS — BP 119/81 | Temp 97.2°F | Wt 181.0 lb

## 2013-05-01 DIAGNOSIS — O280 Abnormal hematological finding on antenatal screening of mother: Secondary | ICD-10-CM

## 2013-05-01 DIAGNOSIS — Z3492 Encounter for supervision of normal pregnancy, unspecified, second trimester: Secondary | ICD-10-CM

## 2013-05-01 DIAGNOSIS — Z363 Encounter for antenatal screening for malformations: Secondary | ICD-10-CM | POA: Insufficient documentation

## 2013-05-01 DIAGNOSIS — O358XX0 Maternal care for other (suspected) fetal abnormality and damage, not applicable or unspecified: Secondary | ICD-10-CM | POA: Insufficient documentation

## 2013-05-01 DIAGNOSIS — O289 Unspecified abnormal findings on antenatal screening of mother: Secondary | ICD-10-CM

## 2013-05-01 DIAGNOSIS — Z1389 Encounter for screening for other disorder: Secondary | ICD-10-CM | POA: Insufficient documentation

## 2013-05-01 DIAGNOSIS — O99330 Smoking (tobacco) complicating pregnancy, unspecified trimester: Secondary | ICD-10-CM

## 2013-05-01 NOTE — Progress Notes (Signed)
Pulse- 74  Edema-feet

## 2013-05-01 NOTE — Progress Notes (Signed)
+  fm, No lof, no vb, no ctx  +quad screen for downs, redated with anatomy scan, pt initially to f/u with MFM tomorrow, pt will have to reschedule 2/2 to schedule. Will reevaluate quad with new dates for risk of downs and will defer to MFM regarding amniocentesis vs harmony screening.pt states understanding.  Megan Reese is a 27 y.o. G1P0 at [redacted]w[redacted]d presents for ROB  Discussed with Patient:  - Reviewed genetics screen Visual merchandiser screen).   - Patient plans on breast feeding. - Routine precautions discussed (depression, infection s/s).   Patient provided with all pertinent phone numbers for emergencies. - RTC for any VB, regular, painful cramps/ctxs occurring at a rate of >2/10 min, fever (100.5 or higher), n/v/d, any pain that is unresolving or worsening. - RTC in 2 weeks for next appt.  Problems: Patient Active Problem List   Diagnosis Date Noted  . 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 F/u 2 wks to discuss results from MFM  [ ]  Vaccines: Flu: recd Tdap:

## 2013-05-01 NOTE — Progress Notes (Signed)
Anatomy US done today. Dates were changed, so Quad Screen, which was abnormal for [facility], will be recalculated. Has appt in MFM tomorrow to discuss further.

## 2013-05-01 NOTE — Patient Instructions (Signed)
Pregnancy - Second Trimester The second trimester is the period between 13 to 27 weeks of your pregnancy. It is important to follow your doctor's instructions. HOME CARE   Do not smoke.  Do not drink alcohol or use drugs.  Only take medicine as told by your doctor.  Take prenatal vitamins as told. The vitamin should contain 1 milligram of folic acid.  Exercise.  Eat healthy foods. Eat regular, well-balanced meals.  You can have sex (intercourse) if there are no other problems with the pregnancy.  Do not use hot tubs, steam rooms, or saunas.  Wear a seat belt while driving.  Avoid raw meat, uncooked cheese, and litter boxes and soil used by cats.  Visit your dentist. Cleanings are okay. GET HELP RIGHT AWAY IF:   You have a temperature by mouth above 102 F (38.9 C), not controlled by medicine.  Fluid is coming from your vagina.  Blood is coming from your vagina. Light spotting is common, especially after sex (intercourse).  You have a bad smelling fluid (discharge) coming from the vagina. The fluid changes from clear to white.  You still feel sick to your stomach (nauseous).  You throw up (vomit) blood.  You lose or gain more than 2 pounds (0.9 kilograms) of weight in a week, or as suggested by your doctor.  Your face, hands, feet, or legs get puffy (swell).  You get exposed to German measles and have never had them.  You get exposed to fifth disease or chickenpox.  You have belly (abdominal) pain.  You have a bad headache that will not go away.  You have watery poop (diarrhea), pain when you pee (urinate), or have shortness of breath.  You start to have problems seeing (blurry or double vision).  You fall, are in a car accident, or have any kind of trauma.  There is mental or physical violence at home.  You have any concerns or worries during your pregnancy. MAKE SURE YOU:   Understand these instructions.  Will watch your condition.  Will get help  right away if you are not doing well or get worse. Document Released: 09/08/2009 Document Revised: 09/06/2011 Document Reviewed: 09/08/2009 ExitCare Patient Information 2014 [employer].  

## 2013-05-02 ENCOUNTER — Telehealth: Payer: Self-pay | Admitting: General Practice

## 2013-05-02 ENCOUNTER — Telehealth (HOSPITAL_COMMUNITY): Payer: Self-pay | Admitting: *Deleted

## 2013-05-02 ENCOUNTER — Telehealth: Payer: Self-pay | Admitting: *Deleted

## 2013-05-02 NOTE — Telephone Encounter (Signed)
Received another call from MFM Victorino Dike) who then spoke with Dr. Ike Bene, they decided we did not need to redraw quad screen or recalculate- patient screen is positive and needs genetic counseling. Called Ruquayyah and notified her does not need quad screen recalculated or redrawn and does need genetic counseling as screen is positive. Gave her appointment date- but patient needs to change- transferred to MFM to reschedule.

## 2013-05-02 NOTE — Telephone Encounter (Signed)
Received call from MFM need quad screen to be reran based on new EDD of 09/22/13- previous screen positive but dating wrong

## 2013-05-03 ENCOUNTER — Ambulatory Visit (HOSPITAL_COMMUNITY)
Admission: RE | Admit: 2013-05-03 | Discharge: 2013-05-03 | Disposition: A | Discharge status: Home / Self Care | Payer: No Typology Code available for payment source | Source: Ambulatory Visit | Attending: Family Medicine | Admitting: Family Medicine

## 2013-05-03 ENCOUNTER — Other Ambulatory Visit: Payer: No Typology Code available for payment source

## 2013-05-03 NOTE — Progress Notes (Signed)
Genetic Counseling  High-Risk Gestation Note  Appointment Date:  05/03/2013 Referred By: Minta Balsam, MD Date of Birth:  [birth year]    Pregnancy History: G1P0 Estimated Date of Delivery: 09/22/13 Estimated Gestational Age: [redacted]w[redacted]d Attending: Rema Fendt, MD   [name] was seen for genetic counseling because of an increased risk for fetal Down syndrome based on Quad screen performed through [facility]. She was accompanied by a friend to today's visit.   They were counseled regarding the Quad screen result and the associated 1 in 28 risk for fetal Down syndrome.  We reviewed chromosomes, nondisjunction, and the common features and variable prognosis of Down syndrome.  In addition, we reviewed the screen adjusted reduction in risks for trisomy 18 (1 in 36,300) and ONTDs (1 in 27,300).  We also discussed other explanations for a screen positive result including: a gestational dating error, differences in maternal metabolism, and normal variation. They understand that this screening is not diagnostic for Down syndrome but provides a risk assessment. We specifically discussed that the level of one of the proteins analyzed on the screen, Inhibin, was very high (3.61 MoM).  This has been associated with an increased risk for growth restriction or poor pregnancy outcome later in pregnancy; therefore, we would recommend offering a follow up ultrasound for fetal growth in the third trimester.  We reviewed available screening options including noninvasive prenatal screening (NIPS)/cell free fetal DNA (cffDNA) testing, and detailed ultrasound.  She was counseled that screening tests are used to modify a patient's a priori risk for aneuploidy, typically based on age. This estimate provides a pregnancy specific risk assessment. We reviewed the benefits and limitations of each option. Specifically, we discussed the conditions for which each test screens, the detection rates, and false positive  rates of each. [name] previously had detailed ultrasound performed through [facility] on 04/27/13. We reviewed the results of this ultrasound. Visualized fetal anatomy was reportedly normal, and no soft markers of aneuploidy were visualized at that time. She was also counseled regarding diagnostic testing via amniocentesis. We reviewed the approximate 1 in 300-500 risk for complications for amniocentesis, including spontaneous pregnancy loss.   After consideration of all the options, she declined NIPS and amniocentesis. [name] stated that she was comfortable with the current risk assessment. She understands that screening tests cannot rule out all birth defects or genetic syndromes. The patient was advised of this limitation and states she still does not want additional testing at this time. Given the elevated maternal serum DIA, a follow-up ultrasound in the third trimester to assess fetal growth was offered. [facility].   [name] was provided with written information regarding cystic fibrosis (CF) including the carrier frequency and incidence in the Caucasian population, the availability of carrier testing and prenatal diagnosis if indicated.  In addition, we discussed that CF is routinely screened for as part of the [place] newborn screening panel.  She declined testing today.   Both family histories were reviewed and found to be noncontributory for birth defects, intellectual disability, recurrent pregnancy loss, or known genetic conditions. The patient had limited information regarding the extended family history for the father of the pregnancy. We, thus cannot comment on how this history may contribute to the risk for a birth defect or genetic condition in the current pregnancy. Without further information regarding the provided family history, an accurate genetic risk cannot be calculated. Further genetic counseling is warranted  if  more information is obtained.  [name] denied exposure to environmental toxins or chemical agents. She denied the use of alcohol, tobacco or street drugs. She denied significant viral illnesses during the course of her pregnancy. Her medical and surgical histories were noncontributory.   I counseled [name] for approximately 40 minutes regarding the above risks and available options.   Quinn Plowman, MS,  Certified Genetic Counselor 05/03/2013

## 2013-05-04 ENCOUNTER — Encounter (HOSPITAL_COMMUNITY): Payer: Self-pay

## 2013-05-04 ENCOUNTER — Ambulatory Visit (HOSPITAL_COMMUNITY): Payer: No Typology Code available for payment source

## 2013-05-15 ENCOUNTER — Encounter: Payer: No Typology Code available for payment source | Admitting: Family Medicine

## 2013-05-16 ENCOUNTER — Encounter: Payer: Self-pay | Admitting: *Deleted

## 2013-05-16 LAB — AFP, QUAD SCREEN
AFP: 17.6 IU/mL
Age Alone: 1:955 {titer}
Curr Gest Age: 15.3 wks.days
Down Syndrome Scr Risk Est: 1:23 {titer}
HCG, Total: 52271 m[IU]/mL
MoM for INH: 3.63
MoM for hCG: 2.38
Open Spina bifida: NEGATIVE
Trisomy 18 (Edward) Syndrome Interp.: 1:31100 {titer}

## 2013-05-30 ENCOUNTER — Other Ambulatory Visit: Payer: Self-pay | Admitting: Advanced Practice Midwife

## 2013-05-30 DIAGNOSIS — O280 Abnormal hematological finding on antenatal screening of mother: Secondary | ICD-10-CM

## 2013-05-31 ENCOUNTER — Encounter: Payer: Self-pay | Admitting: Advanced Practice Midwife

## 2013-05-31 ENCOUNTER — Ambulatory Visit (INDEPENDENT_AMBULATORY_CARE_PROVIDER_SITE_OTHER): Payer: PRIVATE HEALTH INSURANCE | Admitting: Advanced Practice Midwife

## 2013-05-31 ENCOUNTER — Ambulatory Visit (HOSPITAL_COMMUNITY)
Admission: RE | Admit: 2013-05-31 | Discharge: 2013-05-31 | Disposition: A | Discharge status: Home / Self Care | Payer: PRIVATE HEALTH INSURANCE | Source: Ambulatory Visit | Attending: Advanced Practice Midwife | Admitting: Advanced Practice Midwife

## 2013-05-31 VITALS — BP 119/72 | HR 93 | Wt 191.0 lb

## 2013-05-31 VITALS — BP 123/78 | Wt 189.9 lb

## 2013-05-31 DIAGNOSIS — O99331 Smoking (tobacco) complicating pregnancy, first trimester: Secondary | ICD-10-CM

## 2013-05-31 DIAGNOSIS — O289 Unspecified abnormal findings on antenatal screening of mother: Secondary | ICD-10-CM

## 2013-05-31 DIAGNOSIS — O99330 Smoking (tobacco) complicating pregnancy, unspecified trimester: Secondary | ICD-10-CM

## 2013-05-31 DIAGNOSIS — O280 Abnormal hematological finding on antenatal screening of mother: Secondary | ICD-10-CM

## 2013-05-31 NOTE — Progress Notes (Signed)
P - 97 

## 2013-05-31 NOTE — Progress Notes (Signed)
Just had growth [place] in MFM. They recommend doing one every month due to Increased DSR. They will schedule them.  Works at [employer], doing Curator work. Advised no lifting over 25 lbs, pt states they will let her just do oil changes, etc

## 2013-05-31 NOTE — Progress Notes (Signed)
Megan Reese was seen for ultrasound appointment today.  Please see [facility] report for details.

## 2013-05-31 NOTE — Patient Instructions (Signed)
Second Trimester of Pregnancy The second trimester is from week 13 through week 28, months 4 through 6. The second trimester is often a time when you feel your best. Your body has also adjusted to being pregnant, and you begin to feel better physically. Usually, morning sickness has lessened or quit completely, you may have more energy, and you may have an increase in appetite. The second trimester is also a time when the fetus is growing rapidly. At the end of the sixth month, the fetus is about 9 inches long and weighs about 1 pounds. You will likely begin to feel the baby move (quickening) between 18 and 20 weeks of the pregnancy. BODY CHANGES Your body goes through many changes during pregnancy. The changes vary from woman to woman.   Your weight will continue to increase. You will notice your lower abdomen bulging out.  You may begin to get stretch marks on your hips, abdomen, and breasts.  You may develop headaches that can be relieved by medicines approved by your caregiver.  You may urinate more often because the fetus is pressing on your bladder.  You may develop or continue to have heartburn as a result of your pregnancy.  You may develop constipation because certain hormones are causing the muscles that push waste through your intestines to slow down.  You may develop hemorrhoids or swollen, bulging veins (varicose veins).  You may have back pain because of the weight gain and pregnancy hormones relaxing your joints between the bones in your pelvis and as a result of a shift in weight and the muscles that support your balance.  Your breasts will continue to grow and be tender.  Your gums may bleed and may be sensitive to brushing and flossing.  Dark spots or blotches (chloasma, mask of pregnancy) may develop on your face. This will likely fade after the baby is born.  A dark line from your belly button to the pubic area (linea nigra) may appear. This will likely fade after the  baby is born. WHAT TO EXPECT AT YOUR PRENATAL VISITS During a routine prenatal visit:  You will be weighed to make sure you and the fetus are growing normally.  Your blood pressure will be taken.  Your abdomen will be measured to track your baby's growth.  The fetal heartbeat will be listened to.  Any test results from the previous visit will be discussed. Your caregiver may ask you:  How you are feeling.  If you are feeling the baby move.  If you have had any abnormal symptoms, such as leaking fluid, bleeding, severe headaches, or abdominal cramping.  If you have any questions. Other tests that may be performed during your second trimester include:  Blood tests that check for:  Low iron levels (anemia).  Gestational diabetes (between 24 and 28 weeks).  Rh antibodies.  Urine tests to check for infections, diabetes, or protein in the urine.  An ultrasound to confirm the proper growth and development of the baby.  An amniocentesis to check for possible genetic problems.  Fetal screens for spina bifida and Down syndrome. HOME CARE INSTRUCTIONS   Avoid all smoking, herbs, alcohol, and unprescribed drugs. These chemicals affect the formation and growth of the baby.  Follow your caregiver's instructions regarding medicine use. There are medicines that are either safe or unsafe to take during pregnancy.  Exercise only as directed by your caregiver. Experiencing uterine cramps is a good sign to stop exercising.  Continue to eat regular,   healthy meals.  Wear a good support bra for breast tenderness.  Do not use hot tubs, steam rooms, or saunas.  Wear your seat belt at all times when driving.  Avoid raw meat, uncooked cheese, cat litter boxes, and soil used by cats. These carry germs that can cause birth defects in the baby.  Take your prenatal vitamins.  Try taking a stool softener (if your caregiver approves) if you develop constipation. Eat more high-fiber foods,  such as fresh vegetables or fruit and whole grains. Drink plenty of fluids to keep your urine clear or pale yellow.  Take warm sitz baths to soothe any pain or discomfort caused by hemorrhoids. Use hemorrhoid cream if your caregiver approves.  If you develop varicose veins, wear support hose. Elevate your feet for 15 minutes, 3 4 times a day. Limit salt in your diet.  Avoid heavy lifting, wear low heel shoes, and practice good posture.  Rest with your legs elevated if you have leg cramps or low back pain.  Visit your dentist if you have not gone yet during your pregnancy. Use a soft toothbrush to brush your teeth and be gentle when you floss.  A sexual relationship may be continued unless your caregiver directs you otherwise.  Continue to go to all your prenatal visits as directed by your caregiver. SEEK MEDICAL CARE IF:   You have dizziness.  You have mild pelvic cramps, pelvic pressure, or nagging pain in the abdominal area.  You have persistent nausea, vomiting, or diarrhea.  You have a bad smelling vaginal discharge.  You have pain with urination. SEEK IMMEDIATE MEDICAL CARE IF:   You have a fever.  You are leaking fluid from your vagina.  You have spotting or bleeding from your vagina.  You have severe abdominal cramping or pain.  You have rapid weight gain or loss.  You have shortness of breath with chest pain.  You notice sudden or extreme swelling of your face, hands, ankles, feet, or legs.  You have not felt your baby move in over an hour.  You have severe headaches that do not go away with medicine.  You have vision changes. Document Released: 06/08/2001 Document Revised: 02/14/2013 Document Reviewed: 08/15/2012 ExitCare Patient Information 2014 [employer].  

## 2013-06-28 NOTE — L&D Delivery Note (Signed)
Delivery Note At 3:09 PM a healthy female was delivered via Vaginal, Spontaneous Delivery (Presentation: OA>LOA  ).  APGAR: 8, 9; weight .   Placenta status: Intact, Spontaneous.  Cord: 3 vessels with the following complications: None.    Anesthesia: Epidural  Episiotomy: None Lacerations: Labial bilat labial minora superficial lacerations Suture Repair: 3.0 vicryl Est. Blood Loss (mL): 350  Mom to postpartum.  Baby to Couplet care / Skin to Skin.  Abryanna Musolino 09/24/2013, 3:35 PM

## 2013-07-05 ENCOUNTER — Ambulatory Visit (HOSPITAL_COMMUNITY)
Admission: RE | Admit: 2013-07-05 | Discharge: 2013-07-05 | Disposition: A | Discharge status: Home / Self Care | Payer: Managed Care, Other (non HMO) | Source: Ambulatory Visit | Attending: Advanced Practice Midwife | Admitting: Advanced Practice Midwife

## 2013-07-05 ENCOUNTER — Encounter: Payer: Self-pay | Admitting: Advanced Practice Midwife

## 2013-07-05 ENCOUNTER — Ambulatory Visit (INDEPENDENT_AMBULATORY_CARE_PROVIDER_SITE_OTHER): Payer: Managed Care, Other (non HMO) | Admitting: Advanced Practice Midwife

## 2013-07-05 VITALS — BP 122/77 | Temp 97.2°F | Wt 199.3 lb

## 2013-07-05 DIAGNOSIS — Z3689 Encounter for other specified antenatal screening: Secondary | ICD-10-CM | POA: Insufficient documentation

## 2013-07-05 DIAGNOSIS — M5432 Sciatica, left side: Secondary | ICD-10-CM

## 2013-07-05 DIAGNOSIS — O280 Abnormal hematological finding on antenatal screening of mother: Secondary | ICD-10-CM

## 2013-07-05 DIAGNOSIS — O99331 Smoking (tobacco) complicating pregnancy, first trimester: Secondary | ICD-10-CM

## 2013-07-05 DIAGNOSIS — Z3480 Encounter for supervision of other normal pregnancy, unspecified trimester: Secondary | ICD-10-CM

## 2013-07-05 DIAGNOSIS — O99330 Smoking (tobacco) complicating pregnancy, unspecified trimester: Secondary | ICD-10-CM

## 2013-07-05 DIAGNOSIS — O289 Unspecified abnormal findings on antenatal screening of mother: Secondary | ICD-10-CM | POA: Insufficient documentation

## 2013-07-05 DIAGNOSIS — M5430 Sciatica, unspecified side: Secondary | ICD-10-CM

## 2013-07-05 LAB — POCT URINALYSIS DIP (DEVICE)
BILIRUBIN URINE: NEGATIVE
GLUCOSE, UA: NEGATIVE mg/dL
HGB URINE DIPSTICK: NEGATIVE
Ketones, ur: NEGATIVE mg/dL
Leukocytes, UA: NEGATIVE
NITRITE: NEGATIVE
PH: 6 (ref 5.0–8.0)
Protein, ur: 30 mg/dL — AB
Urobilinogen, UA: 0.2 mg/dL (ref 0.0–1.0)

## 2013-07-05 LAB — CBC
HEMATOCRIT: 32.8 % — AB (ref 36.0–46.0)
HEMOGLOBIN: 11.3 g/dL — AB (ref 12.0–15.0)
MCH: 30.4 pg (ref 26.0–34.0)
MCHC: 34.5 g/dL (ref 30.0–36.0)
MCV: 88.2 fL (ref 78.0–100.0)
Platelets: 232 10*3/uL (ref 150–400)
RBC: 3.72 MIL/uL — AB (ref 3.87–5.11)
RDW: 12.8 % (ref 11.5–15.5)
WBC: 8.3 10*3/uL (ref 4.0–10.5)

## 2013-07-05 NOTE — Patient Instructions (Signed)
Third Trimester of Pregnancy  The third trimester is from week 29 through week 42, months 7 through 9. The third trimester is a time when the fetus is growing rapidly. At the end of the ninth month, the fetus is about 20 inches in length and weighs 6 10 pounds.   BODY CHANGES  Your body goes through many changes during pregnancy. The changes vary from woman to woman.    Your weight will continue to increase. You can expect to gain 25 35 pounds (11 16 kg) by the end of the pregnancy.   You may begin to get stretch marks on your hips, abdomen, and breasts.   You may urinate more often because the fetus is moving lower into your pelvis and pressing on your bladder.   You may develop or continue to have heartburn as a result of your pregnancy.   You may develop constipation because certain hormones are causing the muscles that push waste through your intestines to slow down.   You may develop hemorrhoids or swollen, bulging veins (varicose veins).   You may have pelvic pain because of the weight gain and pregnancy hormones relaxing your joints between the bones in your pelvis. Back aches may result from over exertion of the muscles supporting your posture.   Your breasts will continue to grow and be tender. A yellow discharge may leak from your breasts called colostrum.   Your belly button may stick out.   You may feel short of breath because of your expanding uterus.   You may notice the fetus "dropping," or moving lower in your abdomen.   You may have a bloody mucus discharge. This usually occurs a few days to a week before labor begins.   Your cervix becomes thin and soft (effaced) near your due date.  WHAT TO EXPECT AT YOUR PRENATAL EXAMS   You will have prenatal exams every 2 weeks until week 36. Then, you will have weekly prenatal exams. During a routine prenatal visit:   You will be weighed to make sure you and the fetus are growing normally.   Your blood pressure is taken.   Your abdomen will be  measured to track your baby's growth.   The fetal heartbeat will be listened to.   Any test results from the previous visit will be discussed.   You may have a cervical check near your due date to see if you have effaced.  At around 36 weeks, your caregiver will check your cervix. At the same time, your caregiver will also perform a test on the secretions of the vaginal tissue. This test is to determine if a type of bacteria, Group B streptococcus, is present. Your caregiver will explain this further.  Your caregiver may ask you:   What your birth plan is.   How you are feeling.   If you are feeling the baby move.   If you have had any abnormal symptoms, such as leaking fluid, bleeding, severe headaches, or abdominal cramping.   If you have any questions.  Other tests or screenings that may be performed during your third trimester include:   Blood tests that check for low iron levels (anemia).   Fetal testing to check the health, activity level, and growth of the fetus. Testing is done if you have certain medical conditions or if there are problems during the pregnancy.  FALSE LABOR  You may feel small, irregular contractions that eventually go away. These are called Braxton Hicks contractions, or   false labor. Contractions may last for hours, days, or even weeks before true labor sets in. If contractions come at regular intervals, intensify, or become painful, it is best to be seen by your caregiver.   SIGNS OF LABOR    Menstrual-like cramps.   Contractions that are 5 minutes apart or less.   Contractions that start on the top of the uterus and spread down to the lower abdomen and back.   A sense of increased pelvic pressure or back pain.   A watery or bloody mucus discharge that comes from the vagina.  If you have any of these signs before the 37th week of pregnancy, call your caregiver right away. You need to go to the hospital to get checked immediately.  HOME CARE INSTRUCTIONS    Avoid all  smoking, herbs, alcohol, and unprescribed drugs. These chemicals affect the formation and growth of the baby.   Follow your caregiver's instructions regarding medicine use. There are medicines that are either safe or unsafe to take during pregnancy.   Exercise only as directed by your caregiver. Experiencing uterine cramps is a good sign to stop exercising.   Continue to eat regular, healthy meals.   Wear a good support bra for breast tenderness.   Do not use hot tubs, steam rooms, or saunas.   Wear your seat belt at all times when driving.   Avoid raw meat, uncooked cheese, cat litter boxes, and soil used by cats. These carry germs that can cause birth defects in the baby.   Take your prenatal vitamins.   Try taking a stool softener (if your caregiver approves) if you develop constipation. Eat more high-fiber foods, such as fresh vegetables or fruit and whole grains. Drink plenty of fluids to keep your urine clear or pale yellow.   Take warm sitz baths to soothe any pain or discomfort caused by hemorrhoids. Use hemorrhoid cream if your caregiver approves.   If you develop varicose veins, wear support hose. Elevate your feet for 15 minutes, 3 4 times a day. Limit salt in your diet.   Avoid heavy lifting, wear low heal shoes, and practice good posture.   Rest a lot with your legs elevated if you have leg cramps or low back pain.   Visit your dentist if you have not gone during your pregnancy. Use a soft toothbrush to brush your teeth and be gentle when you floss.   A sexual relationship may be continued unless your caregiver directs you otherwise.   Do not travel far distances unless it is absolutely necessary and only with the approval of your caregiver.   Take prenatal classes to understand, practice, and ask questions about the labor and delivery.   Make a trial run to the hospital.   Pack your hospital bag.   Prepare the baby's nursery.   Continue to go to all your prenatal visits as directed  by your caregiver.  SEEK MEDICAL CARE IF:   You are unsure if you are in labor or if your water has broken.   You have dizziness.   You have mild pelvic cramps, pelvic pressure, or nagging pain in your abdominal area.   You have persistent nausea, vomiting, or diarrhea.   You have a bad smelling vaginal discharge.   You have pain with urination.  SEEK IMMEDIATE MEDICAL CARE IF:    You have a fever.   You are leaking fluid from your vagina.   You have spotting or bleeding from your vagina.     You have severe abdominal cramping or pain.   You have rapid weight loss or gain.   You have shortness of breath with chest pain.   You notice sudden or extreme swelling of your face, hands, ankles, feet, or legs.   You have not felt your baby move in over an hour.   You have severe headaches that do not go away with medicine.   You have vision changes.  Document Released: 06/08/2001 Document Revised: 02/14/2013 Document Reviewed: 08/15/2012  Exit[employer] Patient Information 2014 [employer].

## 2013-07-05 NOTE — Progress Notes (Signed)
Doing well. Last US showed good growth at 41%.  Has MFM US today too. Glucola today. Numbness is probably sciatica. Has stopped lifting at work. Recommend pillowbetween legs at night, no lifting

## 2013-07-05 NOTE — Progress Notes (Signed)
Pulse- 93  Edema-feet    Pt reports left leg numbness tdap ca

## 2013-07-06 LAB — GLUCOSE TOLERANCE, 1 HOUR (50G) W/O FASTING: Glucose, 1 Hour GTT: 102 mg/dL (ref 70–140)

## 2013-07-06 LAB — HIV ANTIBODY (ROUTINE TESTING W REFLEX): HIV: NONREACTIVE

## 2013-07-06 LAB — RPR

## 2013-07-09 ENCOUNTER — Encounter: Payer: Self-pay | Admitting: Advanced Practice Midwife

## 2013-07-19 ENCOUNTER — Encounter: Payer: Self-pay | Admitting: Advanced Practice Midwife

## 2013-07-26 ENCOUNTER — Other Ambulatory Visit: Payer: Self-pay | Admitting: Advanced Practice Midwife

## 2013-07-26 DIAGNOSIS — O280 Abnormal hematological finding on antenatal screening of mother: Secondary | ICD-10-CM

## 2013-08-02 ENCOUNTER — Ambulatory Visit (HOSPITAL_COMMUNITY)
Admission: RE | Admit: 2013-08-02 | Discharge: 2013-08-02 | Disposition: A | Discharge status: Home / Self Care | Payer: Managed Care, Other (non HMO) | Source: Ambulatory Visit | Attending: Advanced Practice Midwife | Admitting: Advanced Practice Midwife

## 2013-08-02 DIAGNOSIS — O289 Unspecified abnormal findings on antenatal screening of mother: Secondary | ICD-10-CM | POA: Insufficient documentation

## 2013-08-02 DIAGNOSIS — O280 Abnormal hematological finding on antenatal screening of mother: Secondary | ICD-10-CM

## 2013-08-10 ENCOUNTER — Encounter: Payer: Self-pay | Admitting: Advanced Practice Midwife

## 2013-09-06 ENCOUNTER — Ambulatory Visit (INDEPENDENT_AMBULATORY_CARE_PROVIDER_SITE_OTHER): Payer: Managed Care, Other (non HMO) | Admitting: Obstetrics and Gynecology

## 2013-09-06 VITALS — BP 118/78 | Temp 97.1°F | Wt 215.0 lb

## 2013-09-06 DIAGNOSIS — O99331 Smoking (tobacco) complicating pregnancy, first trimester: Secondary | ICD-10-CM

## 2013-09-06 DIAGNOSIS — Z23 Encounter for immunization: Secondary | ICD-10-CM

## 2013-09-06 DIAGNOSIS — Z3403 Encounter for supervision of normal first pregnancy, third trimester: Secondary | ICD-10-CM

## 2013-09-06 DIAGNOSIS — Z3400 Encounter for supervision of normal first pregnancy, unspecified trimester: Secondary | ICD-10-CM

## 2013-09-06 DIAGNOSIS — O99330 Smoking (tobacco) complicating pregnancy, unspecified trimester: Secondary | ICD-10-CM

## 2013-09-06 LAB — OB RESULTS CONSOLE GBS: STREP GROUP B AG: NEGATIVE

## 2013-09-06 LAB — OB RESULTS CONSOLE GC/CHLAMYDIA
Chlamydia: NEGATIVE
Gonorrhea: NEGATIVE

## 2013-09-06 NOTE — Progress Notes (Signed)
Doing well. Works FT. tDaP discussed> will get today. BC methods reviewed. S/Sx labor discussed.

## 2013-09-06 NOTE — Progress Notes (Signed)
Pulse- 87 Patient states it is very difficulty to come every week because of her work schedule and that she has to take the whole day off work

## 2013-09-06 NOTE — Patient Instructions (Signed)
Contraception Choices Contraception (birth control) is the use of any methods or devices to prevent pregnancy. Below are some methods to help avoid pregnancy. HORMONAL METHODS   Contraceptive implant This is a thin, plastic tube containing progesterone hormone. It does not contain estrogen hormone. Your health care provider inserts the tube in the inner part of the upper arm. The tube can remain in place for up to 3 years. After 3 years, the implant must be removed. The implant prevents the ovaries from releasing an egg (ovulation), thickens the cervical mucus to prevent sperm from entering the uterus, and thins the lining of the inside of the uterus.  Progesterone-only injections These injections are given every 3 months by your health care provider to prevent pregnancy. This synthetic progesterone hormone stops the ovaries from releasing eggs. It also thickens cervical mucus and changes the uterine lining. This makes it harder for sperm to survive in the uterus.  Birth control pills These pills contain estrogen and progesterone hormone. They work by preventing the ovaries from releasing eggs (ovulation). They also cause the cervical mucus to thicken, preventing the sperm from entering the uterus. Birth control pills are prescribed by a health care provider.Birth control pills can also be used to treat heavy periods.  Minipill This type of birth control pill contains only the progesterone hormone. They are taken every day of each month and must be prescribed by your health care provider.  Birth control patch The patch contains hormones similar to those in birth control pills. It must be changed once a week and is prescribed by a health care provider.  Vaginal ring The ring contains hormones similar to those in birth control pills. It is left in the vagina for 3 weeks, removed for 1 week, and then a new one is put back in place. The patient must be comfortable inserting and removing the ring from the  vagina.A health care provider's prescription is necessary.  Emergency contraception Emergency contraceptives prevent pregnancy after unprotected sexual intercourse. This pill can be taken right after sex or up to 5 days after unprotected sex. It is most effective the sooner you take the pills after having sexual intercourse. Most emergency contraceptive pills are available without a prescription. Check with your pharmacist. Do not use emergency contraception as your only form of birth control. BARRIER METHODS   Female condom This is a thin sheath (latex or rubber) that is worn over the penis during sexual intercourse. It can be used with spermicide to increase effectiveness.  Female condom. This is a soft, loose-fitting sheath that is put into the vagina before sexual intercourse.  Diaphragm This is a soft, latex, dome-shaped barrier that must be fitted by a health care provider. It is inserted into the vagina, along with a spermicidal jelly. It is inserted before intercourse. The diaphragm should be left in the vagina for 6 to 8 hours after intercourse.  Cervical cap This is a round, soft, latex or plastic cup that fits over the cervix and must be fitted by a health care provider. The cap can be left in place for up to 48 hours after intercourse.  Sponge This is a soft, circular piece of polyurethane foam. The sponge has spermicide in it. It is inserted into the vagina after wetting it and before sexual intercourse.  Spermicides These are chemicals that kill or block sperm from entering the cervix and uterus. They come in the form of creams, jellies, suppositories, foam, or tablets. They do not require a   prescription. They are inserted into the vagina with an applicator before having sexual intercourse. The process must be repeated every time you have sexual intercourse. INTRAUTERINE CONTRACEPTION  Intrauterine device (IUD) This is a T-shaped device that is put in a woman's uterus during a  menstrual period to prevent pregnancy. There are 2 types:  Copper IUD This type of IUD is wrapped in copper wire and is placed inside the uterus. Copper makes the uterus and fallopian tubes produce a fluid that kills sperm. It can stay in place for 10 years.  Hormone IUD This type of IUD contains the hormone progestin (synthetic progesterone). The hormone thickens the cervical mucus and prevents sperm from entering the uterus, and it also thins the uterine lining to prevent implantation of a fertilized egg. The hormone can weaken or kill the sperm that get into the uterus. It can stay in place for 3 5 years, depending on which type of IUD is used. PERMANENT METHODS OF CONTRACEPTION  Female tubal ligation This is when the woman's fallopian tubes are surgically sealed, tied, or blocked to prevent the egg from traveling to the uterus.  Hysteroscopic sterilization This involves placing a small coil or insert into each fallopian tube. Your doctor uses a technique called hysteroscopy to do the procedure. The device causes scar tissue to form. This results in permanent blockage of the fallopian tubes, so the sperm cannot fertilize the egg. It takes about 3 months after the procedure for the tubes to become blocked. You must use another form of birth control for these 3 months.  Female sterilization This is when the female has the tubes that carry sperm tied off (vasectomy).This blocks sperm from entering the vagina during sexual intercourse. After the procedure, the man can still ejaculate fluid (semen). NATURAL PLANNING METHODS  Natural family planning This is not having sexual intercourse or using a barrier method (condom, diaphragm, cervical cap) on days the woman could become pregnant.  Calendar method This is keeping track of the length of each menstrual cycle and identifying when you are fertile.  Ovulation method This is avoiding sexual intercourse during ovulation.  Symptothermal method This is  avoiding sexual intercourse during ovulation, using a thermometer and ovulation symptoms.  Post ovulation method This is timing sexual intercourse after you have ovulated. Regardless of which type or method of contraception you choose, it is important that you use condoms to protect against the transmission of sexually transmitted infections (STIs). Talk with your health care provider about which form of contraception is most appropriate for you. Document Released: 06/14/2005 Document Revised: 02/14/2013 Document Reviewed: 12/07/2012 [employer] Patient Information 2014 [employer].  

## 2013-09-07 LAB — GC/CHLAMYDIA PROBE AMP
CT PROBE, AMP APTIMA: NEGATIVE
GC PROBE AMP APTIMA: NEGATIVE

## 2013-09-08 LAB — CULTURE, BETA STREP (GROUP B ONLY)

## 2013-09-13 ENCOUNTER — Encounter: Payer: Managed Care, Other (non HMO) | Admitting: Obstetrics and Gynecology

## 2013-09-20 ENCOUNTER — Ambulatory Visit (INDEPENDENT_AMBULATORY_CARE_PROVIDER_SITE_OTHER): Payer: Managed Care, Other (non HMO) | Admitting: Obstetrics & Gynecology

## 2013-09-20 ENCOUNTER — Encounter: Payer: Self-pay | Admitting: Obstetrics & Gynecology

## 2013-09-20 VITALS — BP 121/79 | Temp 97.8°F | Wt 216.0 lb

## 2013-09-20 DIAGNOSIS — O99330 Smoking (tobacco) complicating pregnancy, unspecified trimester: Secondary | ICD-10-CM

## 2013-09-20 DIAGNOSIS — O289 Unspecified abnormal findings on antenatal screening of mother: Secondary | ICD-10-CM

## 2013-09-20 DIAGNOSIS — O280 Abnormal hematological finding on antenatal screening of mother: Secondary | ICD-10-CM | POA: Insufficient documentation

## 2013-09-20 LAB — POCT URINALYSIS DIP (DEVICE)
Glucose, UA: NEGATIVE mg/dL
Hgb urine dipstick: NEGATIVE
Leukocytes, UA: NEGATIVE
Nitrite: NEGATIVE
Protein, ur: 30 mg/dL — AB
SPECIFIC GRAVITY, URINE: 1.025 (ref 1.005–1.030)
Urobilinogen, UA: 4 mg/dL — ABNORMAL HIGH (ref 0.0–1.0)
pH: 6 (ref 5.0–8.0)

## 2013-09-20 NOTE — Progress Notes (Signed)
No complaints.  Pt requested female provider.  Pt understands that a female provider may not be available at time of delivery.  No issues today.  Encourage patient to take tour of hospital.  Discussed signs of labor.

## 2013-09-20 NOTE — Progress Notes (Signed)
Pulse 107 Edema trace in ankles. C/o of persistent lower back pain X2days and chills.

## 2013-09-23 ENCOUNTER — Inpatient Hospital Stay (HOSPITAL_COMMUNITY)
Admission: AD | Admit: 2013-09-23 | Discharge: 2013-09-24 | Disposition: A | Discharge status: Home / Self Care | Payer: Managed Care, Other (non HMO) | Source: Ambulatory Visit | Attending: Obstetrics & Gynecology | Admitting: Obstetrics & Gynecology

## 2013-09-23 ENCOUNTER — Encounter (HOSPITAL_COMMUNITY): Payer: Self-pay | Admitting: *Deleted

## 2013-09-23 ENCOUNTER — Inpatient Hospital Stay (HOSPITAL_COMMUNITY)
Admission: AD | Admit: 2013-09-23 | Discharge: 2013-09-23 | Disposition: A | Discharge status: Home / Self Care | Payer: Managed Care, Other (non HMO) | Source: Ambulatory Visit | Attending: Obstetrics & Gynecology | Admitting: Obstetrics & Gynecology

## 2013-09-23 DIAGNOSIS — O479 False labor, unspecified: Secondary | ICD-10-CM | POA: Insufficient documentation

## 2013-09-23 DIAGNOSIS — O99331 Smoking (tobacco) complicating pregnancy, first trimester: Secondary | ICD-10-CM

## 2013-09-23 DIAGNOSIS — O280 Abnormal hematological finding on antenatal screening of mother: Secondary | ICD-10-CM

## 2013-09-23 NOTE — MAU Note (Signed)
Contraction 7 minutes apart since 11pm last night. Had light spotting yesterday morning no vaginal bleeding now. Denies leaking of fluid. Good fetal movement.

## 2013-09-23 NOTE — Discharge Instructions (Signed)

## 2013-09-23 NOTE — MAU Note (Signed)
Contractions closer and stronger, ? Leaking fluid since 2235.

## 2013-09-24 ENCOUNTER — Inpatient Hospital Stay (HOSPITAL_COMMUNITY)
Admission: AD | Admit: 2013-09-24 | Discharge: 2013-09-26 | DRG: 775 | Disposition: A | Discharge status: Home / Self Care | Payer: Managed Care, Other (non HMO) | Source: Ambulatory Visit | Attending: Obstetrics & Gynecology | Admitting: Obstetrics & Gynecology

## 2013-09-24 ENCOUNTER — Encounter (HOSPITAL_COMMUNITY): Payer: Managed Care, Other (non HMO) | Admitting: Anesthesiology

## 2013-09-24 ENCOUNTER — Encounter (HOSPITAL_COMMUNITY): Payer: Self-pay | Admitting: *Deleted

## 2013-09-24 ENCOUNTER — Encounter (HOSPITAL_COMMUNITY): Payer: Self-pay | Admitting: Anesthesiology

## 2013-09-24 DIAGNOSIS — O99331 Smoking (tobacco) complicating pregnancy, first trimester: Secondary | ICD-10-CM

## 2013-09-24 DIAGNOSIS — O99344 Other mental disorders complicating childbirth: Secondary | ICD-10-CM

## 2013-09-24 DIAGNOSIS — F909 Attention-deficit hyperactivity disorder, unspecified type: Secondary | ICD-10-CM | POA: Diagnosis present

## 2013-09-24 DIAGNOSIS — F3289 Other specified depressive episodes: Secondary | ICD-10-CM

## 2013-09-24 DIAGNOSIS — F329 Major depressive disorder, single episode, unspecified: Secondary | ICD-10-CM | POA: Diagnosis present

## 2013-09-24 DIAGNOSIS — O280 Abnormal hematological finding on antenatal screening of mother: Secondary | ICD-10-CM

## 2013-09-24 LAB — CBC
HCT: 35.7 % — ABNORMAL LOW (ref 36.0–46.0)
Hemoglobin: 12.4 g/dL (ref 12.0–15.0)
MCH: 29.2 pg (ref 26.0–34.0)
MCHC: 34.7 g/dL (ref 30.0–36.0)
MCV: 84.2 fL (ref 78.0–100.0)
PLATELETS: 212 10*3/uL (ref 150–400)
RBC: 4.24 MIL/uL (ref 3.87–5.11)
RDW: 13.3 % (ref 11.5–15.5)
WBC: 11.8 10*3/uL — AB (ref 4.0–10.5)

## 2013-09-24 LAB — TYPE AND SCREEN
ABO/RH(D): A POS
ANTIBODY SCREEN: NEGATIVE

## 2013-09-24 LAB — ABO/RH: ABO/RH(D): A POS

## 2013-09-24 LAB — RPR: RPR Ser Ql: NONREACTIVE

## 2013-09-24 MED ORDER — PHENYLEPHRINE 40 MCG/ML (10ML) SYRINGE FOR IV PUSH (FOR BLOOD PRESSURE SUPPORT)
80.0000 ug | PREFILLED_SYRINGE | INTRAVENOUS | Status: DC | PRN
  Filled 2013-09-24: qty 2

## 2013-09-24 MED ORDER — LACTATED RINGERS IV SOLN
INTRAVENOUS | Status: DC
  Administered 2013-09-24 (×4): via INTRAVENOUS

## 2013-09-24 MED ORDER — DIBUCAINE 1 % RE OINT: 1.0000 "application " | TOPICAL_OINTMENT | RECTAL | Status: DC | PRN

## 2013-09-24 MED ORDER — EPHEDRINE 5 MG/ML INJ
10.0000 mg | INTRAVENOUS | Status: DC | PRN
  Filled 2013-09-24: qty 2

## 2013-09-24 MED ORDER — ZOLPIDEM TARTRATE 5 MG PO TABS
5.0000 mg | ORAL_TABLET | Freq: Every evening | ORAL | Status: DC | PRN
Start: 2013-09-24 — End: 2013-09-26

## 2013-09-24 MED ORDER — ONDANSETRON HCL 4 MG PO TABS: 4.0000 mg | ORAL_TABLET | ORAL | Status: DC | PRN

## 2013-09-24 MED ORDER — DIPHENHYDRAMINE HCL 25 MG PO CAPS: 25.0000 mg | ORAL_CAPSULE | Freq: Four times a day (QID) | ORAL | Status: DC | PRN

## 2013-09-24 MED ORDER — LIDOCAINE HCL (PF) 1 % IJ SOLN
30.0000 mL | INTRAMUSCULAR | Status: DC | PRN
  Filled 2013-09-24: qty 30

## 2013-09-24 MED ORDER — ACETAMINOPHEN 325 MG PO TABS: 650.0000 mg | ORAL_TABLET | ORAL | Status: DC | PRN

## 2013-09-24 MED ORDER — BENZOCAINE-MENTHOL 20-0.5 % EX AERO: 1.0000 "application " | INHALATION_SPRAY | CUTANEOUS | Status: DC | PRN

## 2013-09-24 MED ORDER — FENTANYL 2.5 MCG/ML BUPIVACAINE 1/10 % EPIDURAL INFUSION (WH - ANES)
14.0000 mL/h | INTRAMUSCULAR | Status: DC | PRN
  Administered 2013-09-24 (×2): 14 mL/h via EPIDURAL
  Filled 2013-09-24 (×2): qty 125

## 2013-09-24 MED ORDER — DIPHENHYDRAMINE HCL 50 MG/ML IJ SOLN: 12.5000 mg | INTRAMUSCULAR | Status: DC | PRN

## 2013-09-24 MED ORDER — NALBUPHINE HCL 10 MG/ML IJ SOLN
10.0000 mg | INTRAMUSCULAR | Status: DC | PRN
  Filled 2013-09-24: qty 1

## 2013-09-24 MED ORDER — OXYCODONE-ACETAMINOPHEN 5-325 MG PO TABS: 1.0000 | ORAL_TABLET | ORAL | Status: DC | PRN

## 2013-09-24 MED ORDER — ONDANSETRON HCL 4 MG/2ML IJ SOLN: 4.0000 mg | INTRAMUSCULAR | Status: DC | PRN

## 2013-09-24 MED ORDER — SIMETHICONE 80 MG PO CHEW: 80.0000 mg | CHEWABLE_TABLET | ORAL | Status: DC | PRN

## 2013-09-24 MED ORDER — WITCH HAZEL-GLYCERIN EX PADS: 1.0000 "application " | MEDICATED_PAD | CUTANEOUS | Status: DC | PRN

## 2013-09-24 MED ORDER — CITRIC ACID-SODIUM CITRATE 334-500 MG/5ML PO SOLN: 30.0000 mL | ORAL | Status: DC | PRN

## 2013-09-24 MED ORDER — LACTATED RINGERS IV SOLN
500.0000 mL | Freq: Once | INTRAVENOUS | Status: AC
  Administered 2013-09-24: 500 mL via INTRAVENOUS

## 2013-09-24 MED ORDER — LANOLIN HYDROUS EX OINT: TOPICAL_OINTMENT | CUTANEOUS | Status: DC | PRN

## 2013-09-24 MED ORDER — TETANUS-DIPHTH-ACELL PERTUSSIS 5-2.5-18.5 LF-MCG/0.5 IM SUSP: 0.5000 mL | Freq: Once | INTRAMUSCULAR | Status: DC

## 2013-09-24 MED ORDER — SENNOSIDES-DOCUSATE SODIUM 8.6-50 MG PO TABS
2.0000 | ORAL_TABLET | ORAL | Status: DC
  Administered 2013-09-25 – 2013-09-26 (×2): 2 via ORAL
  Filled 2013-09-24 (×2): qty 2

## 2013-09-24 MED ORDER — OXYTOCIN 40 UNITS IN LACTATED RINGERS INFUSION - SIMPLE MED
62.5000 mL/h | INTRAVENOUS | Status: DC
  Administered 2013-09-24: 62.5 mL/h via INTRAVENOUS
  Filled 2013-09-24: qty 1000

## 2013-09-24 MED ORDER — PRENATAL MULTIVITAMIN CH
1.0000 | ORAL_TABLET | Freq: Every day | ORAL | Status: DC
  Administered 2013-09-25 – 2013-09-26 (×2): 1 via ORAL
  Filled 2013-09-24 (×2): qty 1

## 2013-09-24 MED ORDER — LACTATED RINGERS IV SOLN: 500.0000 mL | INTRAVENOUS | Status: DC | PRN

## 2013-09-24 MED ORDER — IBUPROFEN 600 MG PO TABS
600.0000 mg | ORAL_TABLET | Freq: Four times a day (QID) | ORAL | Status: DC
  Administered 2013-09-24 – 2013-09-26 (×8): 600 mg via ORAL
  Filled 2013-09-24 (×8): qty 1

## 2013-09-24 MED ORDER — EPHEDRINE 5 MG/ML INJ
10.0000 mg | INTRAVENOUS | Status: DC | PRN
  Filled 2013-09-24: qty 2
  Filled 2013-09-24: qty 4

## 2013-09-24 MED ORDER — OXYTOCIN BOLUS FROM INFUSION
500.0000 mL | INTRAVENOUS | Status: DC
Start: 2013-09-24 — End: 2013-09-24

## 2013-09-24 MED ORDER — FLEET ENEMA 7-19 GM/118ML RE ENEM: 1.0000 | ENEMA | RECTAL | Status: DC | PRN

## 2013-09-24 MED ORDER — ONDANSETRON HCL 4 MG/2ML IJ SOLN: 4.0000 mg | Freq: Four times a day (QID) | INTRAMUSCULAR | Status: DC | PRN

## 2013-09-24 MED ORDER — NALBUPHINE HCL 10 MG/ML IJ SOLN
10.0000 mg | INTRAMUSCULAR | Status: DC | PRN
  Administered 2013-09-24: 10 mg via INTRAMUSCULAR
  Filled 2013-09-24: qty 1

## 2013-09-24 MED ORDER — IBUPROFEN 600 MG PO TABS: 600.0000 mg | ORAL_TABLET | Freq: Four times a day (QID) | ORAL | Status: DC | PRN

## 2013-09-24 MED ORDER — PHENYLEPHRINE 40 MCG/ML (10ML) SYRINGE FOR IV PUSH (FOR BLOOD PRESSURE SUPPORT)
80.0000 ug | PREFILLED_SYRINGE | INTRAVENOUS | Status: DC | PRN
  Filled 2013-09-24: qty 10
  Filled 2013-09-24: qty 2

## 2013-09-24 NOTE — Lactation Note (Signed)
This note was copied from the chart of Megan Baird [name]. Lactation Consultation Note Initial visit at 6 hours of age.  Mom reports having a good feeding already.  Baby is finishing bath and placed STS.  Minimal assistance needed to latch baby in cross cradle hold after mom attempted cradle latch.  Baby has wide flanged lips with rhythmic suckling and strong jaw movement.  Pillows offered for support.  Feeding frequency discussed and encouraged to feed on demand with early cues.  Hand expression demonstrated with colostrum visible.  [facility] resources given and discussed.    Patient Name: Megan Reese ZOXWR'UToday's Date: 09/24/2013 Reason for consult: Initial assessment   Maternal Data Has patient been taught Hand Expression?: Yes Does the patient have breastfeeding experience prior to this delivery?: No  Feeding Feeding Type: Breast Fed Length of feed:  (10 minuts observed)  LATCH Score/Interventions Latch: Grasps breast easily, tongue down, lips flanged, rhythmical sucking.  Audible Swallowing: A few with stimulation  Type of Nipple: Everted at rest and after stimulation  Comfort (Breast/Nipple): Soft / non-tender     Hold (Positioning): Assistance needed to correctly position infant at breast and maintain latch. Intervention(s): Breastfeeding basics reviewed;Support Pillows;Position options;Skin to skin  LATCH Score: 8  Lactation Tools Discussed/Used     Consult Status Consult Status: Follow-up Date: 09/25/13 Follow-up type: In-patient    Jannifer RodneyShoptaw, Cassadee Vanzandt Lynn 09/24/2013, 10:03 PM

## 2013-09-24 NOTE — MAU Note (Signed)
Worsening contractions.  

## 2013-09-24 NOTE — Progress Notes (Signed)
Patient ID: Megan Reese, female   DOB: 05/18/1986, 28 y.o.   MRN: [id number] Megan SenegalRuqayyah Karie Reese is a 28 y.o. G1P0 at 4239w2d admitted for labor  Subjective: Comfortable. No urge to push.  Objective: BP 125/80  Pulse 114  Temp(Src) 98.3 F (36.8 C) (Oral)  Resp 18  Ht 5\' 6"  (1.676 m)  Wt 99.338 kg (219 lb)  BMI 35.36 kg/m2  SpO2 97%  LMP 12/17/2012  Fetal Heart FHR: `130 bpm, variability: moderate,  accelerations:  Present,  decelerations:  Absent   Contractions: q2-3  SVE:   Dilation: 10 Effacement (%): 100 Station: +2;+3 Exam by:: Dr. Marice Potterove [id number]: OA, clear fluid, no descent with trial push  (DP) Assessment / Plan:  Labor: passive 2nd stage Fetal Wellbeing: Category 1 Pain Control:  epidural Expected mode of delivery: NSVD  Megan Reese 09/24/2013, 1:40 PM

## 2013-09-24 NOTE — Discharge Instructions (Signed)
Braxton Hicks Contractions Pregnancy is commonly associated with contractions of the uterus throughout the pregnancy. Towards the end of pregnancy (32 to 34 weeks), these contractions (Braxton Hicks) can develop more often and may become more forceful. This is not true labor because these contractions do not result in opening (dilatation) and thinning of the cervix. They are sometimes difficult to tell apart from true labor because these contractions can be forceful and people have different pain tolerances. You should not feel embarrassed if you go to the hospital with false labor. Sometimes, the only way to tell if you are in true labor is for your caregiver to follow the changes in the cervix. How to tell the difference between true and false labor:  False labor.  The contractions of false labor are usually shorter, irregular and not as hard as those of true labor.  They are often felt in the front of the lower abdomen and in the groin.  They may leave with walking around or changing positions while lying down.  They get weaker and are shorter lasting as time goes on.  These contractions are usually irregular.  They do not usually become progressively stronger, regular and closer together as with true labor.  True labor.  Contractions in true labor last 30 to 70 seconds, become very regular, usually become more intense, and increase in frequency.  They do not go away with walking.  The discomfort is usually felt in the top of the uterus and spreads to the lower abdomen and low back.  True labor can be determined by your caregiver with an exam. This will show that the cervix is dilating and getting thinner. If there are no prenatal problems or other health problems associated with the pregnancy, it is completely safe to be sent home with false labor and await the onset of true labor. HOME CARE INSTRUCTIONS   Keep up with your usual exercises and instructions.  Take medications as  directed.  Keep your regular prenatal appointment.  Eat and drink lightly if you think you are going into labor.  If BH contractions are making you uncomfortable:  Change your activity position from lying down or resting to walking/walking to resting.  Sit and rest in a tub of warm water.  Drink 2 to 3 glasses of water. Dehydration may cause B-H contractions.  Do slow and deep breathing several times an hour. SEEK IMMEDIATE MEDICAL CARE IF:   Your contractions continue to become stronger, more regular, and closer together.  You have a gushing, burst or leaking of fluid from the vagina.  An oral temperature above 102 F (38.9 C) develops.  You have passage of blood-tinged mucus.  You develop vaginal bleeding.  You develop continuous belly (abdominal) pain.  You have low back pain that you never had before.  You feel the baby's head pushing down causing pelvic pressure.  The baby is not moving as much as it used to. Document Released: 06/14/2005 Document Revised: 09/06/2011 Document Reviewed: 03/26/2013 [employer] Patient Information 2014 [employer].  Fetal Movement Counts Patient Name: __________________________________________________ Patient Due Date: ____________________ Performing a fetal movement count is highly recommended in high-risk pregnancies, but it is good for every pregnant woman to do. Your caregiver may ask you to start counting fetal movements at 28 weeks of the pregnancy. Fetal movements often increase:  After eating a full meal.  After physical activity.  After eating or drinking something sweet or cold.  At rest. Pay attention to when you feel   the baby is most active. This will help you notice a pattern of your baby's sleep and wake cycles and what factors contribute to an increase in fetal movement. It is important to perform a fetal movement count at the same time each day when your baby is normally most active.  HOW TO COUNT FETAL  MOVEMENTS 1. Find a quiet and comfortable area to sit or lie down on your left side. Lying on your left side provides the best blood and oxygen circulation to your baby. 2. Write down the day and time on a sheet of paper or in a journal. 3. Start counting kicks, flutters, swishes, rolls, or jabs in a 2 hour period. You should feel at least 10 movements within 2 hours. 4. If you do not feel 10 movements in 2 hours, wait 2 3 hours and count again. Look for a change in the pattern or not enough counts in 2 hours. SEEK MEDICAL CARE IF:  You feel less than 10 counts in 2 hours, tried twice.  There is no movement in over an hour.  The pattern is changing or taking longer each day to reach 10 counts in 2 hours.  You feel the baby is not moving as he or she usually does. Date: ____________ Movements: ____________ Start time: ____________ Finish time: ____________  Date: ____________ Movements: ____________ Start time: ____________ Finish time: ____________ Date: ____________ Movements: ____________ Start time: ____________ Finish time: ____________ Date: ____________ Movements: ____________ Start time: ____________ Finish time: ____________ Date: ____________ Movements: ____________ Start time: ____________ Finish time: ____________ Date: ____________ Movements: ____________ Start time: ____________ Finish time: ____________ Date: ____________ Movements: ____________ Start time: ____________ Finish time: ____________ Date: ____________ Movements: ____________ Start time: ____________ Finish time: ____________  Date: ____________ Movements: ____________ Start time: ____________ Finish time: ____________ Date: ____________ Movements: ____________ Start time: ____________ Finish time: ____________ Date: ____________ Movements: ____________ Start time: ____________ Finish time: ____________ Date: ____________ Movements: ____________ Start time: ____________ Finish time: ____________ Date: ____________  Movements: ____________ Start time: ____________ Finish time: ____________ Date: ____________ Movements: ____________ Start time: ____________ Finish time: ____________ Date: ____________ Movements: ____________ Start time: ____________ Finish time: ____________  Date: ____________ Movements: ____________ Start time: ____________ Finish time: ____________ Date: ____________ Movements: ____________ Start time: ____________ Finish time: ____________ Date: ____________ Movements: ____________ Start time: ____________ Finish time: ____________ Date: ____________ Movements: ____________ Start time: ____________ Finish time: ____________ Date: ____________ Movements: ____________ Start time: ____________ Finish time: ____________ Date: ____________ Movements: ____________ Start time: ____________ Finish time: ____________ Date: ____________ Movements: ____________ Start time: ____________ Finish time: ____________  Date: ____________ Movements: ____________ Start time: ____________ Finish time: ____________ Date: ____________ Movements: ____________ Start time: ____________ Finish time: ____________ Date: ____________ Movements: ____________ Start time: ____________ Finish time: ____________ Date: ____________ Movements: ____________ Start time: ____________ Finish time: ____________ Date: ____________ Movements: ____________ Start time: ____________ Finish time: ____________ Date: ____________ Movements: ____________ Start time: ____________ Finish time: ____________ Date: ____________ Movements: ____________ Start time: ____________ Finish time: ____________  Date: ____________ Movements: ____________ Start time: ____________ Finish time: ____________ Date: ____________ Movements: ____________ Start time: ____________ Finish time: ____________ Date: ____________ Movements: ____________ Start time: ____________ Finish time: ____________ Date: ____________ Movements: ____________ Start time:  ____________ Finish time: ____________ Date: ____________ Movements: ____________ Start time: ____________ Finish time: ____________ Date: ____________ Movements: ____________ Start time: ____________ Finish time: ____________ Date: ____________ Movements: ____________ Start time: ____________ Finish time: ____________  Date: ____________ Movements: ____________ Start time: ____________ Finish time: ____________ Date: ____________ Movements: ____________ Start   time: ____________ Finish time: ____________ Date: ____________ Movements: ____________ Start time: ____________ Finish time: ____________ Date: ____________ Movements: ____________ Start time: ____________ Finish time: ____________ Date: ____________ Movements: ____________ Start time: ____________ Finish time: ____________ Date: ____________ Movements: ____________ Start time: ____________ Finish time: ____________ Date: ____________ Movements: ____________ Start time: ____________ Finish time: ____________  Date: ____________ Movements: ____________ Start time: ____________ Finish time: ____________ Date: ____________ Movements: ____________ Start time: ____________ Finish time: ____________ Date: ____________ Movements: ____________ Start time: ____________ Finish time: ____________ Date: ____________ Movements: ____________ Start time: ____________ Finish time: ____________ Date: ____________ Movements: ____________ Start time: ____________ Finish time: ____________ Date: ____________ Movements: ____________ Start time: ____________ Finish time: ____________ Date: ____________ Movements: ____________ Start time: ____________ Finish time: ____________  Date: ____________ Movements: ____________ Start time: ____________ Finish time: ____________ Date: ____________ Movements: ____________ Start time: ____________ Finish time: ____________ Date: ____________ Movements: ____________ Start time: ____________ Finish time: ____________ Date:  ____________ Movements: ____________ Start time: ____________ Finish time: ____________ Date: ____________ Movements: ____________ Start time: ____________ Finish time: ____________ Date: ____________ Movements: ____________ Start time: ____________ Finish time: ____________ Document Released: 07/14/2006 Document Revised: 05/31/2012 Document Reviewed: 04/10/2012 [employer] Patient Information 2014 [employer].  

## 2013-09-24 NOTE — Progress Notes (Signed)
[name] is a 28 y.o. G1P0 at 1223w2d admitted for active labor  Subjective: comfotable with epidural. Foley placed. Objective: BP 121/85  Pulse 98  Temp(Src) 98.2 F (36.8 C) (Oral)  Resp 20  Ht 5\' 6"  (1.676 m)  Wt 99.338 kg (219 lb)  BMI 35.36 kg/m2  SpO2 97%  LMP 12/17/2012      FHR- Category 1 UC:   regular, every 3 minutes SVE:   7/100/0 AROM clear fluid  Labs: Lab Results  Component Value Date   WBC 11.8* 09/24/2013   HGB 12.4 09/24/2013   HCT 35.7* 09/24/2013   MCV 84.2 09/24/2013   PLT 212 09/24/2013    Assessment / Plan: Spontaneous labor, progressing normally  Labor: Progressing normally Preeclampsia:  no signs or symptoms of toxicity Fetal Wellbeing:  Category I Pain Control:  Epidural I/D:  n/a Anticipated MOD:  NSVD  Preslea Rhodus C. 09/24/2013, 9:41 AM

## 2013-09-24 NOTE — H&P (Signed)
Megan Reese is a 28 y.o. female presenting for labor. Maternal Medical History:  Reason for admission: Contractions.   Contractions: Onset was 6-12 hours ago.   Frequency: regular.   Perceived severity is moderate.    Fetal activity: Perceived fetal activity is normal.   Last perceived fetal movement was within the past hour.      OB History   Grav Para Term Preterm Abortions TAB SAB Ect Mult Living   1              Past Medical History  Diagnosis Date  . ADHD (attention deficit hyperactivity disorder)   . Depression   . Headache    History reviewed. No pertinent past surgical history. Family History: family history includes Diabetes in her mother. Social History:  reports that she quit smoking about 5 months ago. She has never used smokeless tobacco. She reports that she does not drink alcohol or use illicit drugs.   Prenatal Transfer Tool  Maternal Diabetes: No Genetic Screening: Normal Maternal Ultrasounds/Referrals: Normal Fetal Ultrasounds or other Referrals:  None Maternal Substance Abuse:  No Significant Maternal Medications:  None Significant Maternal Lab Results:  None Other Comments:  None  Review of Systems  Constitutional: Negative.   HENT: Negative.   Eyes: Negative.   Respiratory: Negative.   Cardiovascular: Negative.   Gastrointestinal: Positive for abdominal pain.  Genitourinary: Negative.   Musculoskeletal: Negative.   Skin: Negative.   Neurological: Negative.   Endo/Heme/Allergies: Negative.   Psychiatric/Behavioral: Negative.     Dilation: 5 Effacement (%): 90 Station: 0 Exam by:: Weston,RN Blood pressure 132/84, pulse 95, height 5\' 6"  (1.676 m), weight 219 lb (99.338 kg), last menstrual period 12/17/2012. Maternal Exam:  Uterine Assessment: Contraction strength is moderate.  Contraction frequency is regular.   Abdomen: Patient reports no abdominal tenderness. Fetal presentation: vertex  Introitus: Normal vulva. Normal vagina.     Physical Exam  Constitutional: She is oriented to person, place, and time. She appears well-developed and well-nourished.  HENT:  Head: Normocephalic.  Eyes: Pupils are equal, round, and reactive to light.  Neck: Normal range of motion.  Cardiovascular: Normal rate, regular rhythm, normal heart sounds and intact distal pulses.   Respiratory: Effort normal and breath sounds normal.  GI: Soft. Bowel sounds are normal.  Genitourinary: Vagina normal.  Musculoskeletal: Normal range of motion.  Neurological: She is alert and oriented to person, place, and time. She has normal reflexes.  Skin: Skin is warm and dry.  Psychiatric: She has a normal mood and affect. Her behavior is normal. Judgment and thought content normal.    Prenatal labs: ABO, Rh: A/POS/-- (09/09 1114) Antibody: NEG (09/09 1114) Rubella: 11.70 (09/09 1114) RPR: NON REAC (01/08 1715)  HBsAg: NEGATIVE (09/09 1114)  HIV: NON REACTIVE (01/08 1715)  GBS: Negative (03/12 0000)   Assessment/Plan: admit   LAWSON, MARIE DARLENE 09/24/2013, 6:00 AM

## 2013-09-24 NOTE — Anesthesia Preprocedure Evaluation (Signed)
Anesthesia Evaluation  Patient identified by MRN, date of birth, ID band Patient awake    Reviewed: Allergy & Precautions, H&P , Patient's Chart, lab work & pertinent test results  Airway Mallampati: II TM Distance: >3 FB Neck ROM: full    Dental   Pulmonary former smoker,  breath sounds clear to auscultation        Cardiovascular Rhythm:regular Rate:Normal     Neuro/Psych  Headaches, PSYCHIATRIC DISORDERS    GI/Hepatic   Endo/Other    Renal/GU      Musculoskeletal   Abdominal   Peds  Hematology   Anesthesia Other Findings   Reproductive/Obstetrics (+) Pregnancy                           Anesthesia Physical Anesthesia Plan  ASA: II  Anesthesia Plan: Epidural   Post-op Pain Management:    Induction:   Airway Management Planned:   Additional Equipment:   Intra-op Plan:   Post-operative Plan:   Informed Consent: I have reviewed the patients History and Physical, chart, labs and discussed the procedure including the risks, benefits and alternatives for the proposed anesthesia with the patient or authorized representative who has indicated his/her understanding and acceptance.     Plan Discussed with:   Anesthesia Plan Comments:         Anesthesia Quick Evaluation

## 2013-09-24 NOTE — Procedures (Signed)
Epidural catheter removed tip intact.

## 2013-09-25 NOTE — Lactation Note (Signed)
This note was copied from the chart of Megan Reese Eagen. Lactation Consultation Note  Patient Name: Megan Reese Pruitt [id number]'s Date: 09/25/2013 Reason for consult: Follow-up assessment   Maternal Data Formula Feeding for Exclusion: No Infant to breast within first hour of birth: Yes Has patient been taught Hand Expression?: Yes Does the patient have breastfeeding experience prior to this delivery?: No  Feeding Feeding Type: Breast Fed Length of feed: 10 min (per mom )  LATCH Score/Interventions Latch: Grasps breast easily, tongue down, lips flanged, rhythmical sucking.  Audible Swallowing: Spontaneous and intermittent  Type of Nipple: Everted at rest and after stimulation  Comfort (Breast/Nipple): Soft / non-tender     Hold (Positioning): Assistance needed to correctly position infant at breast and maintain latch. (worked on positioning and depth ) Intervention(s): Breastfeeding basics reviewed;Support Pillows;Position options;Skin to skin  LATCH Score: 9  Lactation Tools Discussed/Used Tools: Pump Breast pump type: Manual WIC Program: No Pump Review: Setup, frequency, and cleaning;Milk Storage Initiated by:: MAI  Date initiated:: 09/25/13   Consult Status Consult Status: Follow-up Date: 09/26/13 Follow-up type: In-patient    Kathrin Greathouseorio, Jestina Stephani Ann 09/25/2013, 3:41 PM

## 2013-09-25 NOTE — Progress Notes (Signed)
Post Partum Day 1 Subjective: no complaints, up ad lib, voiding, tolerating PO and + flatus  Objective: Blood pressure 104/67, pulse 93, temperature 98.2 F (36.8 C), temperature source Oral, resp. rate 16, height 5\' 6"  (1.676 m), weight 99.338 kg (219 lb), last menstrual period 12/17/2012, SpO2 97.00%, unknown if currently breastfeeding.  Physical Exam:  General: alert, cooperative and no distress Lochia: appropriate Uterine Fundus: firm DVT Evaluation: No evidence of DVT seen on physical exam. Negative Homan's sign. No cords or calf tenderness. No significant calf/ankle edema. CV: RRR Pulm: CTA bilat   Recent Labs  09/24/13 0534  HGB 12.4  HCT 35.7*    Assessment/Plan: Plan for discharge tomorrow   LOS: 1 day   Manya Silvasnderson, Caedyn Raygoza M 09/25/2013, 8:33 AM

## 2013-09-25 NOTE — Anesthesia Postprocedure Evaluation (Signed)
Anesthesia Post Note  Patient: Megan Reese  Procedure(s) Performed: * No procedures listed *  Anesthesia type: Epidural  Patient location: Mother/Baby  Post pain: Pain level controlled  Post assessment: Post-op Vital signs reviewed  Last Vitals:  Filed Vitals:   09/25/13 0510  BP: 104/67  Pulse: 93  Temp: 36.8 C  Resp: 16    Post vital signs: Reviewed  Level of consciousness:alert  Complications: No apparent anesthesia complications

## 2013-09-25 NOTE — Progress Notes (Signed)
Attestation of Attending Supervision of Physician Assistant Student: Evaluation and management procedures were performed by the PA Student under my supervision.  I have seen and examined the patient, reviewed the the student's note and chart, and I agree with management and plan.  Patient desires circumcision for her female infant.  Circumcision procedure details discussed, risks and benefits of procedure were also discussed.  These include but are not limited to: Benefits of circumcision in men include reduction in the rates of urinary tract infection (UTI), penile cancer, some sexually transmitted infections, penile inflammatory and retractile disorders, as well as easier hygiene.  Risks include bleeding , infection, injury of glans which may lead to penile deformity or urinary tract issues, unsatisfactory cosmetic appearance and other potential complications related to the procedure.  It was emphasized that this is an elective procedure.  Patient wants to proceed with circumcision; written informed consent obtained.  Will do circumcision soon, routine circumcision and post circumcision care ordered for the infant.  Jaynie CollinsUGONNA  Maegan Buller, MD, FACOG Attending Obstetrician & Gynecologist Faculty Practice, [facility]

## 2013-09-26 ENCOUNTER — Encounter: Payer: Self-pay | Admitting: *Deleted

## 2013-09-26 MED ORDER — IBUPROFEN 600 MG PO TABS: 600.0000 mg | ORAL_TABLET | Freq: Four times a day (QID) | ORAL | Status: DC

## 2013-09-26 NOTE — Progress Notes (Signed)
Ur chart review completed per request.  

## 2013-09-26 NOTE — Discharge Instructions (Signed)
Breastfeeding Deciding to breastfeed is one of the best choices you can make for you and your baby. A change in hormones during pregnancy causes your breast tissue to grow and increases the number and size of your milk ducts. These hormones also allow proteins, sugars, and fats from your blood supply to make breast milk in your milk-producing glands. Hormones prevent breast milk from being released before your baby is born as well as prompt milk flow after birth. Once breastfeeding has begun, thoughts of your baby, as well as his or her sucking or crying, can stimulate the release of milk from your milk-producing glands.  BENEFITS OF BREASTFEEDING For Your Baby  Your first milk (colostrum) helps your baby's digestive system function better.   There are antibodies in your milk that help your baby fight off infections.   Your baby has a lower incidence of asthma, allergies, and sudden infant death syndrome.   The nutrients in breast milk are better for your baby than infant formulas and are designed uniquely for your baby's needs.   Breast milk improves your baby's brain development.   Your baby is less likely to develop other conditions, such as childhood obesity, asthma, or type 2 diabetes mellitus.  For You   Breastfeeding helps to create a very special bond between you and your baby.   Breastfeeding is convenient. Breast milk is always available at the correct temperature and costs nothing.   Breastfeeding helps to burn calories and helps you lose the weight gained during pregnancy.   Breastfeeding makes your uterus contract to its prepregnancy size faster and slows bleeding (lochia) after you give birth.   Breastfeeding helps to lower your risk of developing type 2 diabetes mellitus, osteoporosis, and breast or ovarian cancer later in life. SIGNS THAT YOUR BABY IS HUNGRY Early Signs of Hunger  Increased alertness or activity.  Stretching.  Movement of the head from  side to side.  Movement of the head and opening of the mouth when the corner of the mouth or cheek is stroked (rooting).  Increased sucking sounds, smacking lips, cooing, sighing, or squeaking.  Hand-to-mouth movements.  Increased sucking of fingers or hands. Late Signs of Hunger  Fussing.  Intermittent crying. Extreme Signs of Hunger Signs of extreme hunger will require calming and consoling before your baby will be able to breastfeed successfully. Do not wait for the following signs of extreme hunger to occur before you initiate breastfeeding:   Restlessness.  A loud, strong cry.   Screaming. BREASTFEEDING BASICS Breastfeeding Initiation  Find a comfortable place to sit or lie down, with your neck and back well supported.  Place a pillow or rolled up blanket under your baby to bring him or her to the level of your breast (if you are seated). Nursing pillows are specially designed to help support your arms and your baby while you breastfeed.  Make sure that your baby's abdomen is facing your abdomen.   Gently massage your breast. With your fingertips, massage from your chest wall toward your nipple in a circular motion. This encourages milk flow. You may need to continue this action during the feeding if your milk flows slowly.  Support your breast with 4 fingers underneath and your thumb above your nipple. Make sure your fingers are well away from your nipple and your baby's mouth.   Stroke your baby's lips gently with your finger or nipple.   When your baby's mouth is open wide enough, quickly bring your baby to your  breast, placing your entire nipple and as much of the colored area around your nipple (areola) as possible into your baby's mouth.   °· More areola should be visible above your baby's upper lip than below the lower lip.   °· Your baby's tongue should be between his or her lower gum and your breast.   °· Ensure that your baby's mouth is correctly positioned  around your nipple (latched). Your baby's lips should create a seal on your breast and be turned out (everted). °· It is common for your baby to suck about 2 3 minutes in order to start the flow of breast milk. °Latching °Teaching your baby how to latch on to your breast properly is very important. An improper latch can cause nipple pain and decreased milk supply for you and poor weight gain in your baby. Also, if your baby is not latched onto your nipple properly, he or she may swallow some air during feeding. This can make your baby fussy. Burping your baby when you switch breasts during the feeding can help to get rid of the air. However, teaching your baby to latch on properly is still the best way to prevent fussiness from swallowing air while breastfeeding. °Signs that your baby has successfully latched on to your nipple:    °· Silent tugging or silent sucking, without causing you pain.   °· Swallowing heard between every 3 4 sucks.   °·  Muscle movement above and in front of his or her ears while sucking.   °Signs that your baby has not successfully latched on to nipple:  °· Sucking sounds or smacking sounds from your baby while breastfeeding. °· Nipple pain. °If you think your baby has not latched on correctly, slip your finger into the corner of your baby's mouth to break the suction and place it between your baby's gums. Attempt breastfeeding initiation again. °Signs of Successful Breastfeeding °Signs from your baby:   °· A gradual decrease in the number of sucks or complete cessation of sucking.   °· Falling asleep.   °· Relaxation of his or her body.   °· Retention of a small amount of milk in his or her mouth.   °· Letting go of your breast by himself or herself. °Signs from you: °· Breasts that have increased in firmness, weight, and size 1 3 hours after feeding.   °· Breasts that are softer immediately after breastfeeding. °· Increased milk volume, as well as a change in milk consistency and color by  the 5th day of breastfeeding.   °· Nipples that are not sore, cracked, or bleeding. °Signs That Your Baby is Getting Enough Milk °· Wetting at least 3 diapers in a 24-hour period. The urine should be clear and pale yellow by age 5 days. °· At least 3 stools in a 24-hour period by age 5 days. The stool should be soft and yellow. °· At least 3 stools in a 24-hour period by age 7 days. The stool should be seedy and yellow. °· No loss of weight greater than 10% of birth weight during the first 3 days of age. °· Average weight gain of 4 7 ounces (120 210 mL) per week after age 4 days. °· Consistent daily weight gain by age 5 days, without weight loss after the age of 2 weeks. °After a feeding, your baby may spit up a small amount. This is common. °BREASTFEEDING FREQUENCY AND DURATION °Frequent feeding will help you make more milk and can prevent sore nipples and breast engorgement. Breastfeed when you feel the need to reduce   the fullness of your breasts or when your baby shows signs of hunger. This is called "breastfeeding on demand." Avoid introducing a pacifier to your baby while you are working to establish breastfeeding (the first 4 6 weeks after your baby is born). After this time you may choose to use a pacifier. Research has shown that pacifier use during the first year of a baby's life decreases the risk of sudden infant death syndrome (SIDS). Allow your baby to feed on each breast as long as he or she wants. Breastfeed until your baby is finished feeding. When your baby unlatches or falls asleep while feeding from the first breast, offer the second breast. Because newborns are often sleepy in the first few weeks of life, you may need to awaken your baby to get him or her to feed. Breastfeeding times will vary from baby to baby. However, the following rules can serve as a guide to help you ensure that your baby is properly fed:  Newborns (babies 67 weeks of age or younger) may breastfeed every 1 3  hours.  Newborns should not go longer than 3 hours during the day or 5 hours during the night without breastfeeding.  You should breastfeed your baby a minimum of 8 times in a 24-hour period until you begin to introduce solid foods to your baby at around 26 months of age. BREAST MILK PUMPING Pumping and storing breast milk allows you to ensure that your baby is exclusively fed your breast milk, even at times when you are unable to breastfeed. This is especially important if you are going back to work while you are still breastfeeding or when you are not able to be present during feedings. Your lactation consultant can give you guidelines on how long it is safe to store breast milk.  A breast pump is a machine that allows you to pump milk from your breast into a sterile bottle. The pumped breast milk can then be stored in a refrigerator or freezer. Some breast pumps are operated by hand, while others use electricity. Ask your lactation consultant which type will work best for you. Breast pumps can be purchased, but some hospitals and breastfeeding support groups lease breast pumps on a monthly basis. A lactation consultant can teach you how to hand express breast milk, if you prefer not to use a pump.  CARING FOR YOUR BREASTS WHILE YOU BREASTFEED Nipples can become dry, cracked, and sore while breastfeeding. The following recommendations can help keep your breasts moisturized and healthy:  Avoid using soap on your nipples.   Wear a supportive bra. Although not required, special nursing bras and tank tops are designed to allow access to your breasts for breastfeeding without taking off your entire bra or top. Avoid wearing underwire style bras or extremely tight bras.  Air dry your nipples for 3 21minutes after each feeding.   Use only cotton bra pads to absorb leaked breast milk. Leaking of breast milk between feedings is normal.   Use lanolin on your nipples after breastfeeding. Lanolin helps to  maintain your skin's normal moisture barrier. If you use pure lanolin you do not need to wash it off before feeding your baby again. Pure lanolin is not toxic to your baby. You may also hand express a few drops of breast milk and gently massage that milk into your nipples and allow the milk to air dry. In the first few weeks after giving birth, some women experience extremely full breasts (engorgement). Engorgement can make  your breasts feel heavy, warm, and tender to the touch. Engorgement peaks within 3 5 days after you give birth. The following recommendations can help ease engorgement:  Completely empty your breasts while breastfeeding or pumping. You may want to start by applying warm, moist heat (in the shower or with warm water-soaked hand towels) just before feeding or pumping. This increases circulation and helps the milk flow. If your baby does not completely empty your breasts while breastfeeding, pump any extra milk after he or she is finished.  Wear a snug bra (nursing or regular) or tank top for 1 2 days to signal your body to slightly decrease milk production.  Apply ice packs to your breasts, unless this is too uncomfortable for you.  Make sure that your baby is latched on and positioned properly while breastfeeding. If engorgement persists after 48 hours of following these recommendations, contact your health care provider or a Science writer. OVERALL HEALTH CARE RECOMMENDATIONS WHILE BREASTFEEDING  Eat healthy foods. Alternate between meals and snacks, eating 3 of each per day. Because what you eat affects your breast milk, some of the foods may make your baby more irritable than usual. Avoid eating these foods if you are sure that they are negatively affecting your baby.  Drink milk, fruit juice, and water to satisfy your thirst (about 10 glasses a day).   Rest often, relax, and continue to take your prenatal vitamins to prevent fatigue, stress, and anemia.  Continue  breast self-awareness checks.  Avoid chewing and smoking tobacco.  Avoid alcohol and drug use. Some medicines that may be harmful to your baby can pass through breast milk. It is important to ask your health care provider before taking any medicine, including all over-the-counter and prescription medicine as well as vitamin and herbal supplements. It is possible to become pregnant while breastfeeding. If birth control is desired, ask your health care provider about options that will be safe for your baby. SEEK MEDICAL CARE IF:   You feel like you want to stop breastfeeding or have become frustrated with breastfeeding.  You have painful breasts or nipples.  Your nipples are cracked or bleeding.  Your breasts are red, tender, or warm.  You have a swollen area on either breast.  You have a fever or chills.  You have nausea or vomiting.  You have drainage other than breast milk from your nipples.  Your breasts do not become full before feedings by the 5th day after you give birth.  You feel sad and depressed.  Your baby is too sleepy to eat well.  Your baby is having trouble sleeping.   Your baby is wetting less than 3 diapers in a 24-hour period.  Your baby has less than 3 stools in a 24-hour period.  Your baby's skin or the white part of his or her eyes becomes yellow.   Your baby is not gaining weight by 35 days of age. SEEK IMMEDIATE MEDICAL CARE IF:   Your baby is overly tired (lethargic) and does not want to wake up and feed.  Your baby develops an unexplained fever. Document Released: 06/14/2005 Document Revised: 02/14/2013 Document Reviewed: 12/06/2012 [facility] Patient Information 2014 [place]. Vaginal Delivery Care After Refer to this sheet in the next few weeks. These discharge instructions provide you with information on caring for yourself after delivery. Your caregiver may also give you specific instructions. Your treatment has been planned according  to the most current medical practices available, but problems sometimes occur. Call  your caregiver if you have any problems or questions after you go home. HOME CARE INSTRUCTIONS  Take over-the-counter or prescription medicines only as directed by your caregiver or pharmacist.  Do not drink alcohol, especially if you are breastfeeding or taking medicine to relieve pain.  Do not chew or smoke tobacco.  Do not use illegal drugs.  Continue to use good perineal care. Good perineal care includes:  Wiping your perineum from front to back.  Keeping your perineum clean.  Do not use tampons or douche until your caregiver says it is okay.  Shower, wash your hair, and take tub baths as directed by your caregiver.  Wear a well-fitting bra that provides breast support.  Eat healthy foods.  Drink enough fluids to keep your urine clear or pale yellow.  Eat high-fiber foods such as whole grain cereals and breads, brown rice, beans, and fresh fruits and vegetables every day. These foods may help prevent or relieve constipation.  Follow your cargiver's recommendations regarding resumption of activities such as climbing stairs, driving, lifting, exercising, or traveling.  Talk to your caregiver about resuming sexual activities. Resumption of sexual activities is dependent upon your risk of infection, your rate of healing, and your comfort and desire to resume sexual activity.  Try to have someone help you with your household activities and your newborn for at least a few days after you leave the hospital.  Rest as much as possible. Try to rest or take a nap when your newborn is sleeping.  Increase your activities gradually.  Keep all of your scheduled postpartum appointments. It is very important to keep your scheduled follow-up appointments. At these appointments, your caregiver will be checking to make sure that you are healing physically and emotionally. SEEK MEDICAL CARE IF:   You are  passing large clots from your vagina. Save any clots to show your caregiver.  You have a foul smelling discharge from your vagina.  You have trouble urinating.  You are urinating frequently.  You have pain when you urinate.  You have a change in your bowel movements.  You have increasing redness, pain, or swelling near your vaginal incision (episiotomy) or vaginal tear.  You have pus draining from your episiotomy or vaginal tear.  Your episiotomy or vaginal tear is separating.  You have painful, hard, or reddened breasts.  You have a severe headache.  You have blurred vision or see spots.  You feel sad or depressed.  You have thoughts of hurting yourself or your newborn.  You have questions about your care, the care of your newborn, or medicines.  You are dizzy or lightheaded.  You have a rash.  You have nausea or vomiting.  You were breastfeeding and have not had a menstrual period within 12 weeks after you stopped breastfeeding.  You are not breastfeeding and have not had a menstrual period by the 12th week after delivery.  You have a fever. SEEK IMMEDIATE MEDICAL CARE IF:   You have persistent pain.  You have chest pain.  You have shortness of breath.  You faint.  You have leg pain.  You have stomach pain.  Your vaginal bleeding saturates two or more sanitary pads in 1 hour. MAKE SURE YOU:   Understand these instructions.  Will watch your condition.  Will get help right away if you are not doing well or get worse. Document Released: 06/11/2000 Document Revised: 03/08/2012 Document Reviewed: 02/09/2012 [facility] Patient Information 2014 [place], [place].  Contraception Choices Contraception (birth control)  is the use of any methods or devices to prevent pregnancy. Below are some methods to help avoid pregnancy. HORMONAL METHODS   Contraceptive implant This is a thin, plastic tube containing progesterone hormone. It does not contain estrogen  hormone. Your health care provider inserts the tube in the inner part of the upper arm. The tube can remain in place for up to 3 years. After 3 years, the implant must be removed. The implant prevents the ovaries from releasing an egg (ovulation), thickens the cervical mucus to prevent sperm from entering the uterus, and thins the lining of the inside of the uterus.  Progesterone-only injections These injections are given every 3 months by your health care provider to prevent pregnancy. This synthetic progesterone hormone stops the ovaries from releasing eggs. It also thickens cervical mucus and changes the uterine lining. This makes it harder for sperm to survive in the uterus.  Birth control pills These pills contain estrogen and progesterone hormone. They work by preventing the ovaries from releasing eggs (ovulation). They also cause the cervical mucus to thicken, preventing the sperm from entering the uterus. Birth control pills are prescribed by a health care provider.Birth control pills can also be used to treat heavy periods.  Minipill This type of birth control pill contains only the progesterone hormone. They are taken every day of each month and must be prescribed by your health care provider.  Birth control patch The patch contains hormones similar to those in birth control pills. It must be changed once a week and is prescribed by a health care provider.  Vaginal ring The ring contains hormones similar to those in birth control pills. It is left in the vagina for 3 weeks, removed for 1 week, and then a new one is put back in place. The patient must be comfortable inserting and removing the ring from the vagina.A health care provider's prescription is necessary.  Emergency contraception Emergency contraceptives prevent pregnancy after unprotected sexual intercourse. This pill can be taken right after sex or up to 5 days after unprotected sex. It is most effective the sooner you take the  pills after having sexual intercourse. Most emergency contraceptive pills are available without a prescription. Check with your pharmacist. Do not use emergency contraception as your only form of birth control. BARRIER METHODS   Female condom This is a thin sheath (latex or rubber) that is worn over the penis during sexual intercourse. It can be used with spermicide to increase effectiveness.  Female condom. This is a soft, loose-fitting sheath that is put into the vagina before sexual intercourse.  Diaphragm This is a soft, latex, dome-shaped barrier that must be fitted by a health care provider. It is inserted into the vagina, along with a spermicidal jelly. It is inserted before intercourse. The diaphragm should be left in the vagina for 6 to 8 hours after intercourse.  Cervical cap This is a round, soft, latex or plastic cup that fits over the cervix and must be fitted by a health care provider. The cap can be left in place for up to 48 hours after intercourse.  Sponge This is a soft, circular piece of polyurethane foam. The sponge has spermicide in it. It is inserted into the vagina after wetting it and before sexual intercourse.  Spermicides These are chemicals that kill or block sperm from entering the cervix and uterus. They come in the form of creams, jellies, suppositories, foam, or tablets. They do not require a prescription. They are inserted  into the vagina with an applicator before having sexual intercourse. The process must be repeated every time you have sexual intercourse. INTRAUTERINE CONTRACEPTION  Intrauterine device (IUD) This is a T-shaped device that is put in a woman's uterus during a menstrual period to prevent pregnancy. There are 2 types:  Copper IUD This type of IUD is wrapped in copper wire and is placed inside the uterus. Copper makes the uterus and fallopian tubes produce a fluid that kills sperm. It can stay in place for 10 years.  Hormone IUD This type of IUD  contains the hormone progestin (synthetic progesterone). The hormone thickens the cervical mucus and prevents sperm from entering the uterus, and it also thins the uterine lining to prevent implantation of a fertilized egg. The hormone can weaken or kill the sperm that get into the uterus. It can stay in place for 3 5 years, depending on which type of IUD is used. PERMANENT METHODS OF CONTRACEPTION  Female tubal ligation This is when the woman's fallopian tubes are surgically sealed, tied, or blocked to prevent the egg from traveling to the uterus.  Hysteroscopic sterilization This involves placing a small coil or insert into each fallopian tube. Your doctor uses a technique called hysteroscopy to do the procedure. The device causes scar tissue to form. This results in permanent blockage of the fallopian tubes, so the sperm cannot fertilize the egg. It takes about 3 months after the procedure for the tubes to become blocked. You must use another form of birth control for these 3 months.  Female sterilization This is when the female has the tubes that carry sperm tied off (vasectomy).This blocks sperm from entering the vagina during sexual intercourse. After the procedure, the man can still ejaculate fluid (semen). NATURAL PLANNING METHODS  Natural family planning This is not having sexual intercourse or using a barrier method (condom, diaphragm, cervical cap) on days the woman could become pregnant.  Calendar method This is keeping track of the length of each menstrual cycle and identifying when you are fertile.  Ovulation method This is avoiding sexual intercourse during ovulation.  Symptothermal method This is avoiding sexual intercourse during ovulation, using a thermometer and ovulation symptoms.  Post ovulation method This is timing sexual intercourse after you have ovulated. Regardless of which type or method of contraception you choose, it is important that you use condoms to protect against  the transmission of sexually transmitted infections (STIs). Talk with your health care provider about which form of contraception is most appropriate for you. Document Released: 06/14/2005 Document Revised: 02/14/2013 Document Reviewed: 12/07/2012 [facility] Patient Information 2014 [place], [place].

## 2013-09-26 NOTE — Discharge Summary (Signed)
Obstetric Discharge Summary Reason for Admission: onset of labor Prenatal Procedures: NST and ultrasound Intrapartum Procedures: spontaneous vaginal delivery Postpartum Procedures: none Complications-Operative and Postpartum: bilateral labial laceration Hemoglobin  Date Value Ref Range Status  09/24/2013 12.4  12.0 - 15.0 g/dL Final     HCT  Date Value Ref Range Status  09/24/2013 35.7* 36.0 - 46.0 % Final   Medical Course [name] presented to the MAU for SOL.  The labor course was uncomplicated and the patient received an epidural for pain.  She delivered a viable female infant via NSVD at 3:09 pm on 3/30.  The infant had apgars of 8 and 9. Mother sustained bilateral superficial labial lacerations which were repaired with 3.0 vicryl.  EBL was 350.  Breastfeeding is going well and patient plans to use nuvaring for contraception. [facility]  Physical Exam:  General: alert, cooperative and no distress Lochia: appropriate Uterine Fundus: firm Incision: N/A DVT Evaluation: No evidence of DVT seen on physical exam. Negative Homan's sign.  Discharge Diagnoses: Term Pregnancy-delivered  Discharge Information: Date: 09/26/2013 Activity: unrestricted Diet: routine Medications: PNV and Ibuprofen Condition: stable Instructions: refer to practice specific booklet Discharge to: home   Newborn Data: Live born female  Birth Weight: 8 lb 7.8 oz (3850 g) APGAR: 8, 9  Home with mother.  Megan Reese, Megan 09/26/2013, 7:30 AM  I was present for the exam and agree with above. D/C home at 24 hours PP. Discussed risk of decreased milk supply w/ estrogen containing BC. Encouraged non-estrogen methods.   [name], [place] 09/26/2013 10:38 AM

## 2013-09-26 NOTE — Progress Notes (Signed)
CSW met with MOB to assess hx of Depression. MOB states this was a "long time ago." She reports her father was not their for them growing up and that she has been to counseling for it. She also states seeing a counselor and taking Wellbutrin in college at the ADHD clinic for ADHD and stressors related to college. She states no medication at this time and does not feel she needs it. She reports that her mother is her greatest support person, but that she has a lot of friends and family who are supportive. She states she can return to her counselor at [facility] at any time if needed. She was somewhat tearful while we talked and she equated the tears to lack of sleep and exhaustion. She is happy about baby and becoming a mother. FOB is somewhat involved, but she states she is not concerned about his involvement or lack there of. CSW discussed normal/common emotions in the first couple weeks of the PP period as well as signs and symptoms of PPD to watch for. MOB was attentive and agreeable to calling her doctor or counselor if symptoms arise. She is taking baby to [facility] for follow up care and CSW informed her that she can also ask to speak with the social worker there if she has emotional concerns. MOB was appreciative and states no questions, needs or concerns at this time. CSW identifies no barriers to discharge.

## 2013-09-26 NOTE — Discharge Summary (Signed)
Attestation of Attending Supervision of Advanced Practitioner (CNM/NP): Evaluation and management procedures were performed by the Advanced Practitioner under my supervision and collaboration.  I have reviewed the Advanced Practitioner's note and chart, and I agree with the management and plan.  HARRAWAY-SMITH, Nickson Middlesworth 2:20 PM     

## 2013-09-26 NOTE — Lactation Note (Signed)
This note was copied from the chart of Megan Reese Roher. Lactation Consultation Note  Patient Name: Megan Reese Dinsmore QMVHQ'IToday's Date: 09/26/2013 Reason for consult: Follow-up assessment Per mom I was so tired during the night and the baby was cluster feeding , so the baby received to bottles so I could sleep. He has been back to the breast this am for 45 mins. LC reviewed basics and the importance of a deep latch , sore nipple, engorgement prevention and tx. Mom was given a hand pump with instructions yesterday and is aware of the Baby and me booklet. Mom also aware of the [facility] and the [facility].    Maternal Data Has patient been taught Hand Expression?:  (reviewed by LC , steady flow of colostrum )  Feeding Feeding Type:  (per mom recently fed 45 mins , presently awake resting with mom ) Length of feed: 45 min (per mom )  LATCH Score/Interventions Latch: Repeated attempts needed to sustain latch, nipple held in mouth throughout feeding, stimulation needed to elicit sucking reflex. Intervention(s): Assist with latch;Breast massage;Breast compression  Audible Swallowing: Spontaneous and intermittent Intervention(s): Hand expression  Type of Nipple: Everted at rest and after stimulation  Comfort (Breast/Nipple): Filling, red/small blisters or bruises, mild/mod discomfort  Problem noted: Filling Interventions (Filling): Massage;Reverse pressure  Hold (Positioning): Assistance needed to correctly position infant at breast and maintain latch. Intervention(s): Breastfeeding basics reviewed (see LC note)  LATCH Score: 7  Lactation Tools Discussed/Used     Consult Status Consult Status: Complete Date: 09/27/13    Kathrin Greathouseorio, Kabir Brannock Ann 09/26/2013, 11:01 AM

## 2013-09-27 ENCOUNTER — Encounter: Payer: Managed Care, Other (non HMO) | Admitting: Obstetrics & Gynecology

## 2013-10-11 MED ORDER — LIDOCAINE HCL (PF) 1 % IJ SOLN
INTRAMUSCULAR | Status: DC | PRN
  Administered 2013-09-24 (×2): 5 mL

## 2013-10-11 NOTE — Anesthesia Procedure Notes (Signed)
Epidural Patient location during procedure: OB Start time: 09/24/2013 6:24 AM  Staffing Anesthesiologist: Brayton CavesJACKSON, Myeshia Fojtik Performed by: anesthesiologist   Preanesthetic Checklist Completed: patient identified, site marked, surgical consent, pre-op evaluation, timeout performed, IV checked, risks and benefits discussed and monitors and equipment checked  Epidural Patient position: sitting Prep: site prepped and draped and DuraPrep Patient monitoring: continuous pulse ox and blood pressure Approach: midline Location: L3-L4 Injection technique: LOR air  Needle:  Needle type: Tuohy  Needle gauge: 17 G Needle length: 9 cm and 9 Catheter type: closed end flexible Catheter size: 19 Gauge Test dose: negative  Assessment Events: blood not aspirated, injection not painful, no injection resistance, negative IV test and no paresthesia  Additional Notes Patient identified.  Risk benefits discussed including failed block, incomplete pain control, headache, nerve damage, paralysis, blood pressure changes, nausea, vomiting, reactions to medication both toxic or allergic, and postpartum back pain.  Patient expressed understanding and wished to proceed.  All questions were answered.  Sterile technique used throughout procedure and epidural site dressed with sterile barrier dressing. No paresthesia or other complications noted.The patient did not experience any signs of intravascular injection such as tinnitus or metallic taste in mouth nor signs of intrathecal spread such as rapid motor block. Please see nursing notes for vital signs.

## 2013-10-25 ENCOUNTER — Ambulatory Visit (INDEPENDENT_AMBULATORY_CARE_PROVIDER_SITE_OTHER): Payer: Managed Care, Other (non HMO) | Admitting: Nurse Practitioner

## 2013-10-25 ENCOUNTER — Encounter: Payer: Self-pay | Admitting: Nurse Practitioner

## 2013-10-25 VITALS — BP 120/80 | HR 87 | Temp 96.8°F | Ht 66.0 in | Wt 197.1 lb

## 2013-10-25 DIAGNOSIS — J069 Acute upper respiratory infection, unspecified: Secondary | ICD-10-CM

## 2013-10-25 DIAGNOSIS — Z01812 Encounter for preprocedural laboratory examination: Secondary | ICD-10-CM

## 2013-10-25 DIAGNOSIS — Z309 Encounter for contraceptive management, unspecified: Secondary | ICD-10-CM | POA: Insufficient documentation

## 2013-10-25 LAB — POCT PREGNANCY, URINE: Preg Test, Ur: NEGATIVE

## 2013-10-25 MED ORDER — AZITHROMYCIN 250 MG PO TABS: ORAL_TABLET | ORAL | Status: DC

## 2013-10-25 MED ORDER — ETONOGESTREL-ETHINYL ESTRADIOL 0.12-0.015 MG/24HR VA RING: VAGINAL_RING | VAGINAL | Status: DC

## 2013-10-25 NOTE — Patient Instructions (Signed)
Contraception Choices Contraception (birth control) is the use of any methods or devices to prevent pregnancy. Below are some methods to help avoid pregnancy. HORMONAL METHODS   Contraceptive implant This is a thin, plastic tube containing progesterone hormone. It does not contain estrogen hormone. Your health care provider inserts the tube in the inner part of the upper arm. The tube can remain in place for up to 3 years. After 3 years, the implant must be removed. The implant prevents the ovaries from releasing an egg (ovulation), thickens the cervical mucus to prevent sperm from entering the uterus, and thins the lining of the inside of the uterus.  Progesterone-only injections These injections are given every 3 months by your health care provider to prevent pregnancy. This synthetic progesterone hormone stops the ovaries from releasing eggs. It also thickens cervical mucus and changes the uterine lining. This makes it harder for sperm to survive in the uterus.  Birth control pills These pills contain estrogen and progesterone hormone. They work by preventing the ovaries from releasing eggs (ovulation). They also cause the cervical mucus to thicken, preventing the sperm from entering the uterus. Birth control pills are prescribed by a health care provider.Birth control pills can also be used to treat heavy periods.  Minipill This type of birth control pill contains only the progesterone hormone. They are taken every day of each month and must be prescribed by your health care provider.  Birth control patch The patch contains hormones similar to those in birth control pills. It must be changed once a week and is prescribed by a health care provider.  Vaginal ring The ring contains hormones similar to those in birth control pills. It is left in the vagina for 3 weeks, removed for 1 week, and then a new one is put back in place. The patient must be comfortable inserting and removing the ring from the  vagina.A health care provider's prescription is necessary.  Emergency contraception Emergency contraceptives prevent pregnancy after unprotected sexual intercourse. This pill can be taken right after sex or up to 5 days after unprotected sex. It is most effective the sooner you take the pills after having sexual intercourse. Most emergency contraceptive pills are available without a prescription. Check with your pharmacist. Do not use emergency contraception as your only form of birth control. BARRIER METHODS   Female condom This is a thin sheath (latex or rubber) that is worn over the penis during sexual intercourse. It can be used with spermicide to increase effectiveness.  Female condom. This is a soft, loose-fitting sheath that is put into the vagina before sexual intercourse.  Diaphragm This is a soft, latex, dome-shaped barrier that must be fitted by a health care provider. It is inserted into the vagina, along with a spermicidal jelly. It is inserted before intercourse. The diaphragm should be left in the vagina for 6 to 8 hours after intercourse.  Cervical cap This is a round, soft, latex or plastic cup that fits over the cervix and must be fitted by a health care provider. The cap can be left in place for up to 48 hours after intercourse.  Sponge This is a soft, circular piece of polyurethane foam. The sponge has spermicide in it. It is inserted into the vagina after wetting it and before sexual intercourse.  Spermicides These are chemicals that kill or block sperm from entering the cervix and uterus. They come in the form of creams, jellies, suppositories, foam, or tablets. They do not require a   prescription. They are inserted into the vagina with an applicator before having sexual intercourse. The process must be repeated every time you have sexual intercourse. INTRAUTERINE CONTRACEPTION  Intrauterine device (IUD) This is a T-shaped device that is put in a woman's uterus during a  menstrual period to prevent pregnancy. There are 2 types:  Copper IUD This type of IUD is wrapped in copper wire and is placed inside the uterus. Copper makes the uterus and fallopian tubes produce a fluid that kills sperm. It can stay in place for 10 years.  Hormone IUD This type of IUD contains the hormone progestin (synthetic progesterone). The hormone thickens the cervical mucus and prevents sperm from entering the uterus, and it also thins the uterine lining to prevent implantation of a fertilized egg. The hormone can weaken or kill the sperm that get into the uterus. It can stay in place for 3 5 years, depending on which type of IUD is used. PERMANENT METHODS OF CONTRACEPTION  Female tubal ligation This is when the woman's fallopian tubes are surgically sealed, tied, or blocked to prevent the egg from traveling to the uterus.  Hysteroscopic sterilization This involves placing a small coil or insert into each fallopian tube. Your doctor uses a technique called hysteroscopy to do the procedure. The device causes scar tissue to form. This results in permanent blockage of the fallopian tubes, so the sperm cannot fertilize the egg. It takes about 3 months after the procedure for the tubes to become blocked. You must use another form of birth control for these 3 months.  Female sterilization This is when the female has the tubes that carry sperm tied off (vasectomy).This blocks sperm from entering the vagina during sexual intercourse. After the procedure, the man can still ejaculate fluid (semen). NATURAL PLANNING METHODS  Natural family planning This is not having sexual intercourse or using a barrier method (condom, diaphragm, cervical cap) on days the woman could become pregnant.  Calendar method This is keeping track of the length of each menstrual cycle and identifying when you are fertile.  Ovulation method This is avoiding sexual intercourse during ovulation.  Symptothermal method This is  avoiding sexual intercourse during ovulation, using a thermometer and ovulation symptoms.  Post ovulation method This is timing sexual intercourse after you have ovulated. Regardless of which type or method of contraception you choose, it is important that you use condoms to protect against the transmission of sexually transmitted infections (STIs). Talk with your health care provider about which form of contraception is most appropriate for you. Document Released: 06/14/2005 Document Revised: 02/14/2013 Document Reviewed: 12/07/2012 [employer] Patient Information 2014 [employer].  

## 2013-10-25 NOTE — Progress Notes (Signed)
Pt. Here today for PP visit. No complaints except for having a cold since she delivered that she cannot get rid off; c/o of yellow or green mucus and congestion. Using flonase but not taking anything else as she was afraid it would affect breast milk. Pt. Would like to do nuva ring for birth control. Denies having sex since delivery. UPT to be obtained.

## 2013-10-25 NOTE — Progress Notes (Signed)
Patient ID: Megan Reese, female   DOB: [birth year], 28 y.o.   MRN: [id number] Subjective:     Megan Reese is a 28 y.o. female who presents for a postpartum visit. She is 4 week postpartum following a spontaneous vaginal delivery. I have fully reviewed the prenatal and intrapartum course. The delivery was at 40 gestational weeks. Outcome: spontaneous vaginal delivery. Anesthesia: epidural. Postpartum course has been Has had cold like sx for almost 4 weeks and nasal discharge is yellow and cough is yellow. No fever. Nothing OTC has helped.. Baby's course has been uneventful. Baby is feeding by both breast and bottle - Gerber. Bleeding no bleeding. Bowel function is normal. Bladder function is normal. Patient is not sexually active. Contraception method is NuvaRing vaginal inserts. Postpartum depression screening: negative.  The following portions of the patient's history were reviewed and updated as appropriate: current medications, past family history, past medical history, past social history, past surgical history and problem list.  Review of Systems Pertinent items are noted in HPI.   Objective:    BP 120/80  Pulse 87  Temp(Src) 96.8 F (36 C) (Oral)  Ht 5\' 6"  (1.676 m)  Wt 197 lb 1.6 oz (89.404 kg)  BMI 31.83 kg/m2  Breastfeeding? Yes  General:  alert   Breasts:  inspection negative, no nipple discharge or bleeding, no masses or nodularity palpable  Lungs: rhonchi bibasilar  Heart:  regular rate and rhythm, S1, S2 normal, no murmur, click, rub or gallop  Abdomen: soft, non-tender; bowel sounds normal; no masses,  no organomegaly   Vulva:  not evaluated  Vagina: not evaluated  Cervix:  no vaginal exam/ no bleeding  Corpus: not examined  Adnexa:  not evaluated  Rectal Exam: Not performed.        Assessment:   No results found for this or any previous visit (from the past 24 hour(s)).  Pregnancy test negative   postpartum exam. Pap smear not done at today's visit.   Contraception URI Plan:    1. Contraception: NuvaRing vaginal inserts 2. ZPack as directed 3. Follow up in: 1 year for pap smear or as needed.

## 2014-04-29 ENCOUNTER — Encounter: Payer: Self-pay | Admitting: Nurse Practitioner

## 2015-08-28 IMAGING — US US OB FOLLOW-UP
1 series · 12 of 28 positions shown · non-contrast
Comparison: none

[Series 1: us ob follow-up · 12 of 35 slices shown]
[im 2/35]
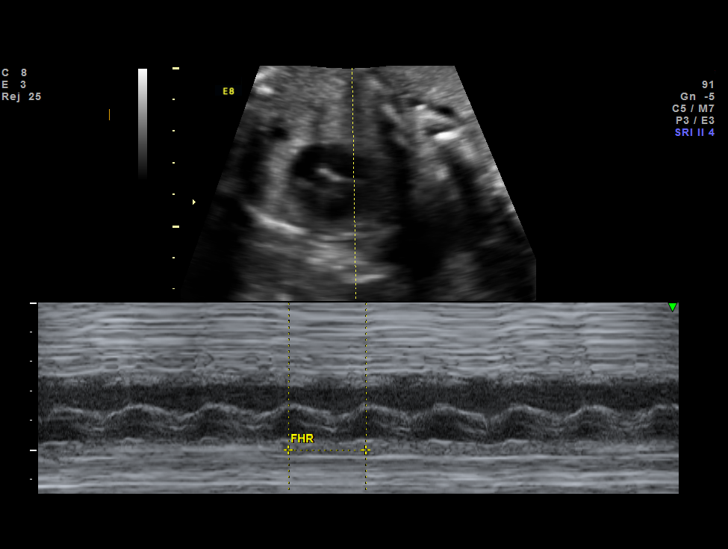
[im 4/35]
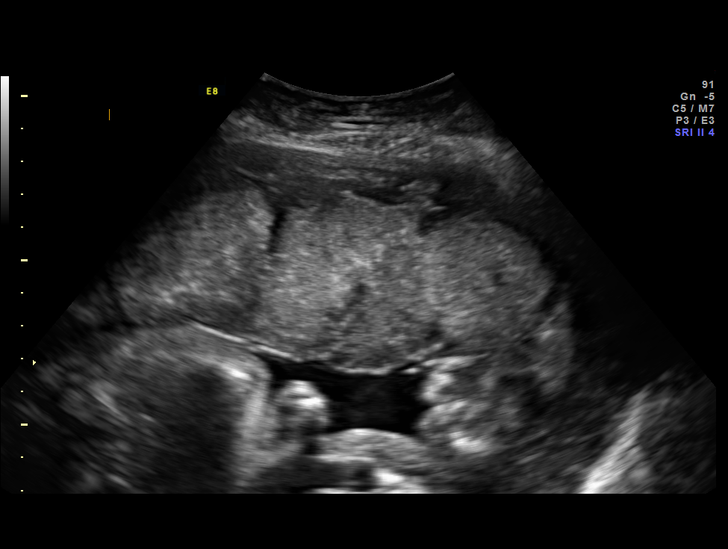
[im 7/35]
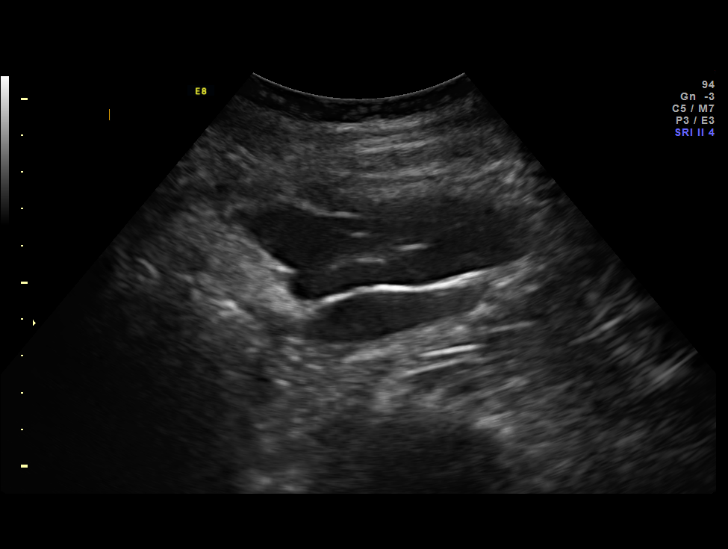
[im 11/35]
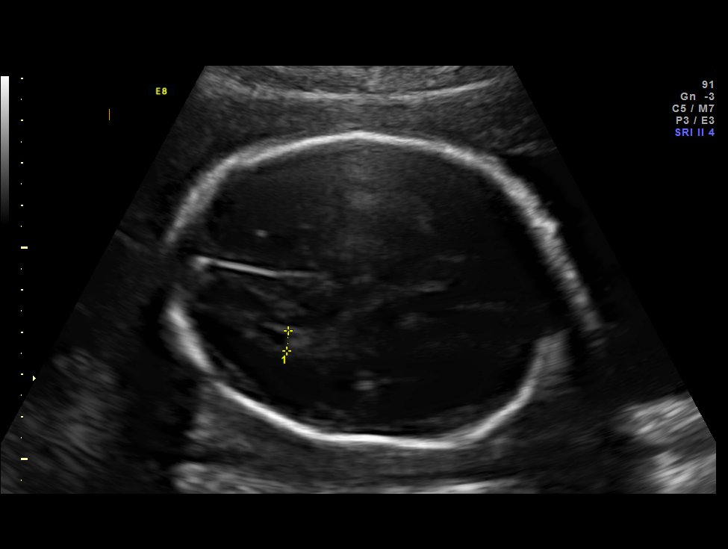
[im 13/35]
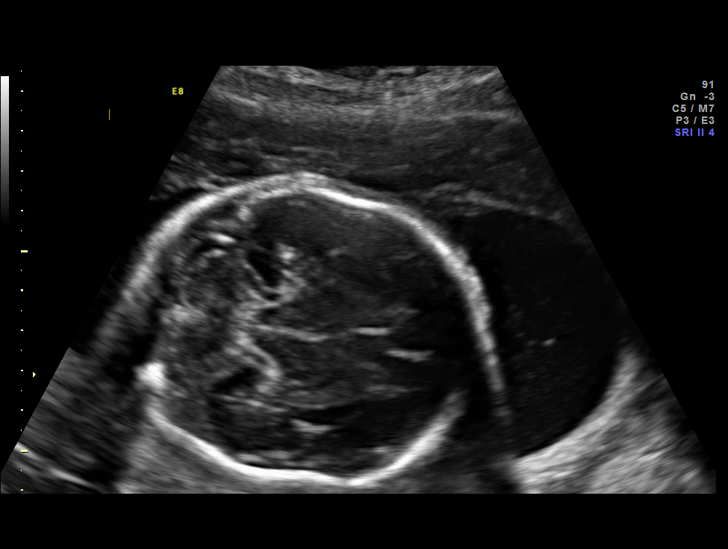
[im 16/35]
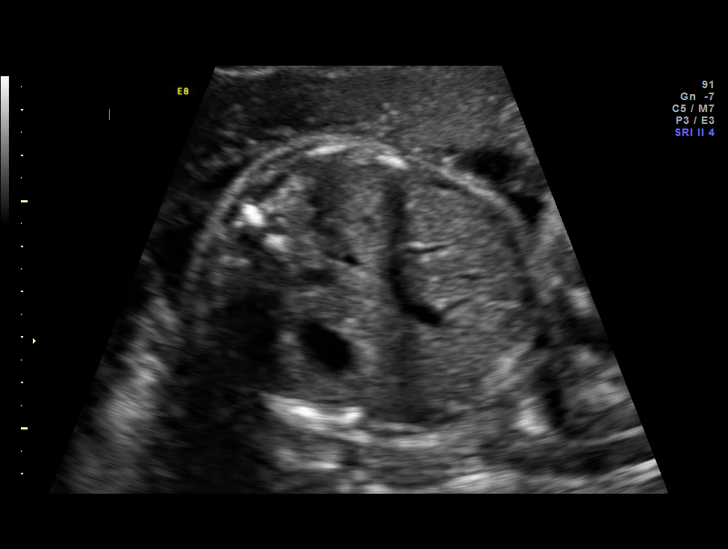
[im 19/35]
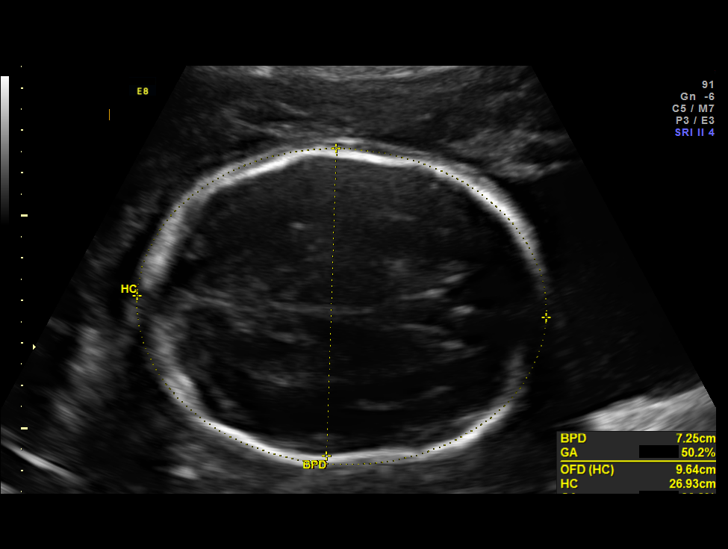
[im 22/35]
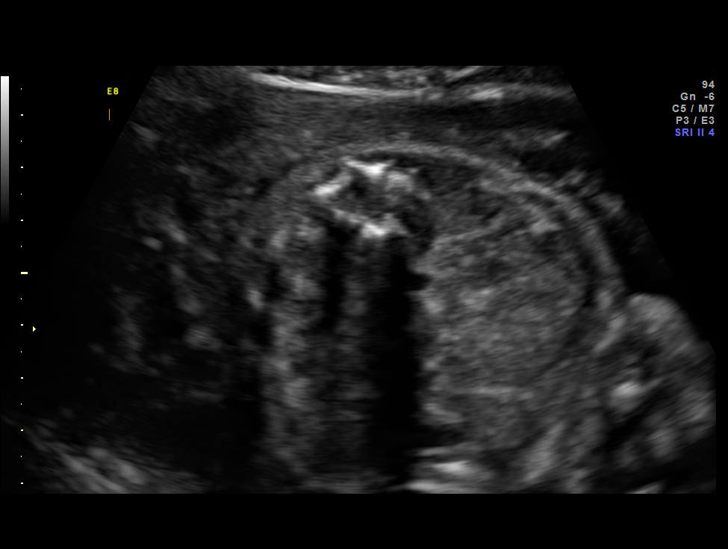
[im 24/35]
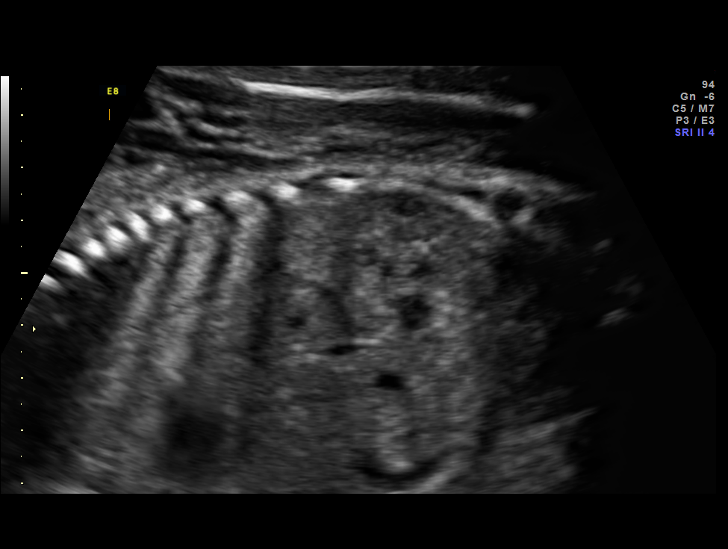
[im 28/35]
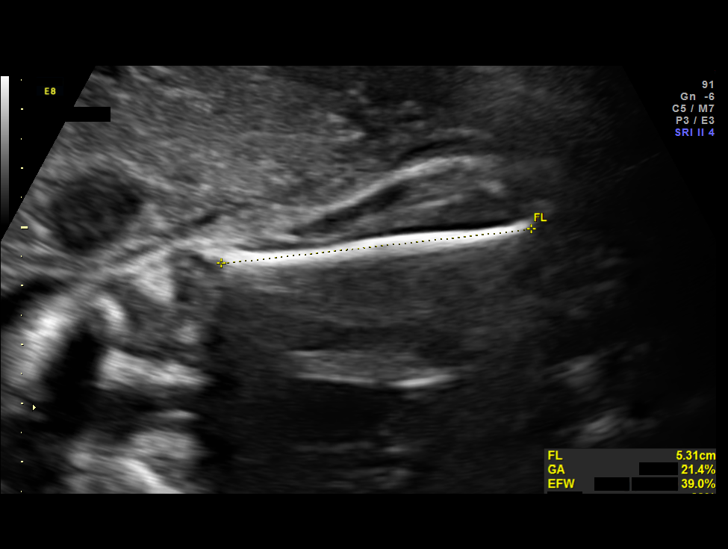
[im 31/35]
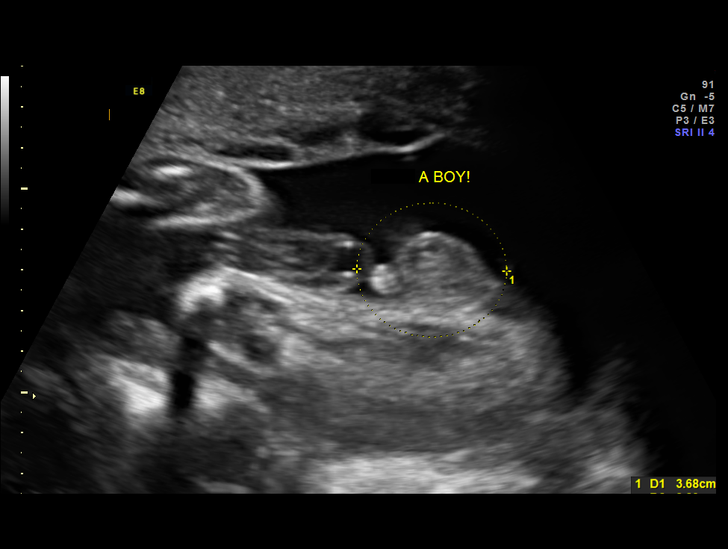
[im 33/35]
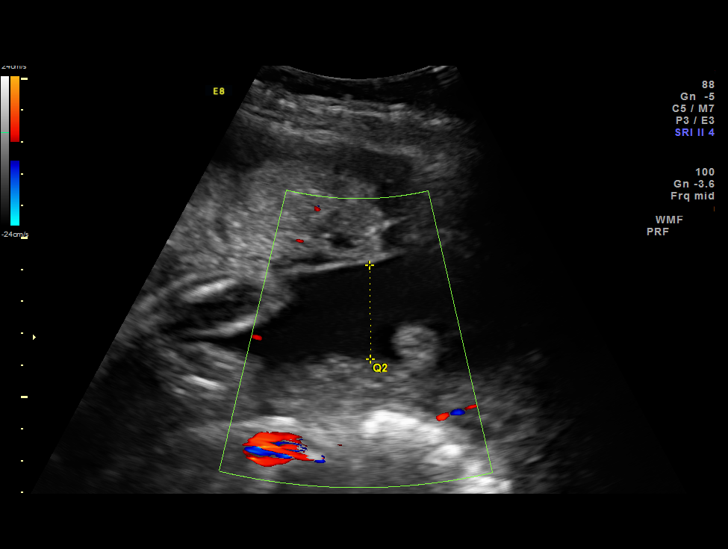

[12 of 28 positions shown; findings below may reference images not displayed]

OBSTETRICS REPORT
                      (Signed Final 07/05/2013 [DATE])

Service(s) Provided

 US OB FOLLOW UP                                       76816.1
Indications

 Abnormal biochemical screen (quad) for Trisomy
 Elevated Inhibin (3.63 MoM)
Fetal Evaluation

 Num Of Fetuses:    1
 Fetal Heart Rate:  130                          bpm
 Cardiac Activity:  Observed
 Presentation:      Cephalic
 Placenta:          Anterior, above cervical os
 P. Cord            Previously Visualized
 Insertion:

 Amniotic Fluid
 AFI FV:      Subjectively within normal limits
 AFI Sum:     15.52   cm       55  %Tile     Larg Pckt:     6.2  cm
 RUQ:   3.7     cm   RLQ:    2.67   cm    LUQ:   6.2     cm   LLQ:    2.95   cm
Biometry

 BPD:     72.7  mm     G. Age:  29w 1d                CI:         74.9   70 - 86
 OFD:       97  mm                                    FL/HC:      19.6   19.6 -

 HC:     271.5  mm     G. Age:  29w 5d       45  %    HC/AC:      1.17   0.99 -

 AC:     232.7  mm     G. Age:  27w 4d       15  %    FL/BPD:     73.0   71 - 87
 FL:      53.1  mm     G. Age:  28w 2d       22  %    FL/AC:      22.8   20 - 24
 HUM:     51.6  mm     G. Age:  30w 1d       75  %
 CER:       34  mm     G. Age:  29w 4d       65  %
 Est. FW:    5512  gm      2 lb 9 oz     39  %
Gestational Age

 LMP:           30w 0d        Date:  12/07/12                 EDD:   09/13/13
 U/S Today:     28w 5d                                        EDD:   09/22/13
 Best:          28w 5d     Det. By:  U/S (05/01/13)           EDD:   09/22/13
Anatomy
 Cranium:          Appears normal         Aortic Arch:      Previously seen
 Fetal Cavum:      Appears normal         Ductal Arch:      Previously seen
 Ventricles:       Appears normal         Diaphragm:        Previously seen
 Choroid Plexus:   Previously seen        Stomach:          Appears normal
 Cerebellum:       Appears normal         Abdomen:          Appears normal
 Posterior Fossa:  Previously seen        Abdominal Wall:   Previously seen
 Nuchal Fold:      Previously seen        Cord Vessels:     Previously seen
 Face:             Orbits and profile     Kidneys:          Appear normal
                   previously seen
 Lips:             Previously seen        Bladder:          Appears normal
 Heart:            Previously seen        Spine:            Previously seen
 RVOT:             Previously seen        Lower             Previously seen
                                          Extremities:
 LVOT:             Previously seen        Upper             Previously seen
                                          Extremities:

 Other:  Fetus appears to be a male. Heels and 5th digit previously visualized.
Cervix Uterus Adnexa

 Cervical Length:    3.6      cm

 Cervix:       Normal appearance by transabdominal scan.

 Adnexa:     No abnormality visualized.
Impression

 Single living intrauterine pregnancy at 28 weeks 5 days.
 Appropriate interval fetal growth (39%).
 Normal amniotic fluid volume.
 Normal interval fetal anatomy.
Recommendations

 Recommend follow-up ultrasound examination in 4 weeks.

 questions or concerns.
                Fausta, Nuno Alves

## 2016-03-13 ENCOUNTER — Inpatient Hospital Stay (HOSPITAL_COMMUNITY)
Admission: AD | Admit: 2016-03-13 | Discharge: 2016-03-13 | Disposition: A | Discharge status: Home / Self Care | Payer: Medicaid Other | Source: Ambulatory Visit | Attending: Obstetrics & Gynecology | Admitting: Obstetrics & Gynecology

## 2016-03-13 ENCOUNTER — Encounter (HOSPITAL_COMMUNITY): Payer: Self-pay

## 2016-03-13 ENCOUNTER — Inpatient Hospital Stay (HOSPITAL_COMMUNITY): Payer: Medicaid Other

## 2016-03-13 DIAGNOSIS — Z3A00 Weeks of gestation of pregnancy not specified: Secondary | ICD-10-CM | POA: Insufficient documentation

## 2016-03-13 DIAGNOSIS — R938 Abnormal findings on diagnostic imaging of other specified body structures: Secondary | ICD-10-CM | POA: Insufficient documentation

## 2016-03-13 DIAGNOSIS — O020 Blighted ovum and nonhydatidiform mole: Secondary | ICD-10-CM | POA: Diagnosis not present

## 2016-03-13 DIAGNOSIS — O4690 Antepartum hemorrhage, unspecified, unspecified trimester: Secondary | ICD-10-CM | POA: Insufficient documentation

## 2016-03-13 DIAGNOSIS — O209 Hemorrhage in early pregnancy, unspecified: Secondary | ICD-10-CM

## 2016-03-13 LAB — URINALYSIS, ROUTINE W REFLEX MICROSCOPIC
BILIRUBIN URINE: NEGATIVE
Glucose, UA: NEGATIVE mg/dL
KETONES UR: NEGATIVE mg/dL
Nitrite: NEGATIVE
Protein, ur: NEGATIVE mg/dL
Specific Gravity, Urine: 1.02 (ref 1.005–1.030)
pH: 7 (ref 5.0–8.0)

## 2016-03-13 LAB — URINE MICROSCOPIC-ADD ON

## 2016-03-13 LAB — POCT PREGNANCY, URINE: PREG TEST UR: POSITIVE — AB

## 2016-03-13 NOTE — MAU Note (Signed)
Patient presents stating she is [redacted] weeks pregnant and has had vaginal bleeding for 2 weeks with cramping, backache, butt cramps, extreme fatigue and migraines.

## 2016-03-13 NOTE — Discharge Instructions (Signed)
°  Blighted Ovum °A blighted ovum is a common kind of early pregnancy failure. It happens when a fertilized egg attaches to the uterus but stops growing. Even though the egg never develops, the body acts like it is pregnant. A sac starts to form around the egg, and tissue to support a baby starts to form in the placenta. °CAUSES °This condition is usually caused by a genetic defect in the egg. °SYMPTOMS °Early symptoms of this condition are the same as those of early pregnancy. They include: °· A missed menstrual period. °· Fatigue. °· Feeling sick to your stomach (nauseous). °· Sore breasts. °Later symptoms are those of pregnancy loss. They include: °· Abdominal cramps. °· Vaginal bleeding or spotting. °· A menstrual period that is heavier than usual. °DIAGNOSIS °This condition is usually diagnosed during a routine ultrasound. It can be confirmed with blood tests. °TREATMENT °This condition may be treated by: °· Waiting until your body naturally gets rid of the empty egg sac and placenta (miscarriage). °· Taking medicine to start a miscarriage. This medicine can be taken by mouth or placed into the vagina. °· Having a surgical procedure to remove the tissue. Your health care provider would open the entrance to your womb (dilation) and remove the tissue (curettage). °HOME CARE INSTRUCTIONS °· Take over-the-counter and prescription medicines only as told by your health care provider. °· Talk to your health care provider about when you can try to get pregnant again. Having this condition does not mean you will lose future pregnancies. °· After your miscarriage: °¨ Rest at home for a few days. °¨ You may bleed heavily for a week or more, and you may have light bleeding for a couple weeks after that. Wear a pad until vaginal bleeding stops. °SEEK MEDICAL CARE IF: °· You have a fever or chills. °· Your pain medicine is not helping. °· You have vaginal bleeding that continues for longer than expected. °SEEK IMMEDIATE  MEDICAL CARE IF: °· You have severe abdominal pain. °· You feel dizzy or faint. °· You pass out. °· You have very heavy vaginal bleeding. A sign that vaginal bleeding is very heavy is if blood soaks through two large sanitary pads an hour for more than two hours. °  °This information is not intended to replace advice given to you by your health care provider. Make sure you discuss any questions you have with your health care provider. °  °Document Released: 09/29/2010 Document Revised: 03/05/2015 Document Reviewed: 10/30/2014 °Elsevier Interactive Patient Education ©2016 [employer] ° °

## 2016-03-13 NOTE — MAU Provider Note (Signed)
History   G2P1001 @ 11.4 wks in with vag bleeding x 2 wks, started out as brown and now red with sm clots. Also some cramping. Blood type A pos  CSN: [id number]  Arrival date & time 03/13/16  1301   None     Chief Complaint  Patient presents with  . Vaginal Bleeding  . Back Pain  . Fatigue  . Migraine    HPI  Past Medical History:  Diagnosis Date  . ADHD (attention deficit hyperactivity disorder)   . Depression   . Headache     History reviewed. No pertinent surgical history.  Family History  Problem Relation Age of Onset  . Diabetes Mother     Social History  Substance Use Topics  . Smoking status: Former Smoker    Quit date: 04/03/2013  . Smokeless tobacco: Never Used  . Alcohol use No    OB History    Gravida Para Term Preterm AB Living   2 1 1     1    SAB TAB Ectopic Multiple Live Births           1      Review of Systems  Constitutional: Negative.   HENT: Negative.   Eyes: Negative.   Respiratory: Negative.   Cardiovascular: Negative.   Gastrointestinal: Positive for abdominal pain.  Genitourinary: Positive for vaginal bleeding.  Musculoskeletal: Negative.   Skin: Negative.   Allergic/Immunologic: Negative.   Neurological: Negative.   Hematological: Negative.   Psychiatric/Behavioral: Negative.     Allergies  Review of patient's allergies indicates no known allergies.  Home Medications    BP 134/92 (BP Location: Right Arm)   Pulse 100   Temp 98.5 F (36.9 C) (Oral)   Resp 18   Ht 5\' 6"  (1.676 m)   Wt 213 lb (96.6 kg)   LMP 12/23/2015   BMI 34.38 kg/m   Physical Exam  Constitutional: She is oriented to person, place, and time. She appears well-developed and well-nourished.  HENT:  Head: Normocephalic.  Eyes: Pupils are equal, round, and reactive to light.  Neck: Normal range of motion.  Cardiovascular: Normal rate, regular rhythm, normal heart sounds and intact distal pulses.   Pulmonary/Chest: Effort normal and breath sounds  normal.  Abdominal: Soft. Bowel sounds are normal.  Genitourinary:  Genitourinary Comments: Scant amt vag bleeding  Musculoskeletal: Normal range of motion.  Neurological: She is alert and oriented to person, place, and time. She has normal reflexes.  Skin: Skin is warm and dry.  Psychiatric: She has a normal mood and affect. Her behavior is normal. Judgment and thought content normal.    MAU Course  Procedures (including critical care time)  Labs Reviewed  POCT PREGNANCY, URINE - Abnormal; Notable for the following:       Result Value   Preg Test, Ur POSITIVE (*)    All other components within normal limits  URINALYSIS, ROUTINE W REFLEX MICROSCOPIC (NOT AT [facility])   No results found.   No diagnosis found.    MDM  preg at 11.4 wks failed pregnancy. Will d/c home

## 2016-03-16 ENCOUNTER — Encounter (HOSPITAL_COMMUNITY): Payer: Self-pay | Admitting: *Deleted

## 2016-03-16 ENCOUNTER — Inpatient Hospital Stay (HOSPITAL_COMMUNITY)
Admission: AD | Admit: 2016-03-16 | Discharge: 2016-03-17 | Disposition: A | Discharge status: Home / Self Care | Payer: Medicaid Other | Source: Ambulatory Visit | Attending: Obstetrics and Gynecology | Admitting: Obstetrics and Gynecology

## 2016-03-16 DIAGNOSIS — O039 Complete or unspecified spontaneous abortion without complication: Secondary | ICD-10-CM | POA: Diagnosis not present

## 2016-03-16 DIAGNOSIS — Z3A12 12 weeks gestation of pregnancy: Secondary | ICD-10-CM | POA: Insufficient documentation

## 2016-03-16 DIAGNOSIS — O209 Hemorrhage in early pregnancy, unspecified: Secondary | ICD-10-CM | POA: Diagnosis present

## 2016-03-16 DIAGNOSIS — Z87891 Personal history of nicotine dependence: Secondary | ICD-10-CM | POA: Diagnosis not present

## 2016-03-16 MED ORDER — HYDROMORPHONE HCL 1 MG/ML IJ SOLN
1.0000 mg | Freq: Once | INTRAMUSCULAR | Status: AC
  Administered 2016-03-16: 1 mg via INTRAVENOUS
  Filled 2016-03-16: qty 1

## 2016-03-16 MED ORDER — SODIUM CHLORIDE 0.9 % IV SOLN
INTRAVENOUS | Status: DC
  Administered 2016-03-16: via INTRAVENOUS

## 2016-03-16 NOTE — MAU Note (Signed)
Pt reports she was here Saturday and was told she had a blighted ovum and at that time she was spotting and cramping . Today the bleeding has been very heavy (one pad every 10 minutes) and the cramping is much worse.

## 2016-03-16 NOTE — MAU Provider Note (Signed)
Chief Complaint: No chief complaint on file.   First Provider Initiated Contact with Patient 03/16/16 2321     SUBJECTIVE HPI: Megan Reese is a 30 y.o. G2P1001 at [redacted]w[redacted]d by LMP who presents to Maternity Admissions reporting heavy vaginal bleeding, passing clots and severe suprapubic cramping x 4 hours. Was seen in maternity admission 03/13/2016. Ultrasound showed gestational sac measuring 7 weeks 2 days with no embryo or yolk sac suspicious for blighted ovum.   Modifying factors: Hasn't tried anything for the pain.  Associated signs and symptoms: Pos for dizziness. Negative for fever, urinary complaints or GI complaints.  Past Medical History:  Diagnosis Date  . ADHD (attention deficit hyperactivity disorder)   . Depression   . Headache    OB History  Gravida Para Term Preterm AB Living  2 1 1     1   SAB TAB Ectopic Multiple Live Births          1    # Outcome Date GA Lbr Len/2nd Weight Sex Delivery Anes PTL Lv  2 Current           1 Term 09/24/13 [redacted]w[redacted]d 10:30 / 01:39 8 lb 7.8 oz (3.85 kg) M Vag-Spont EPI  LIV     Birth Comments: none     History reviewed. No pertinent surgical history. Social History   Social History  . Marital status: Significant Other    Spouse name: N/A  . Number of children: N/A  . Years of education: N/A   Occupational History  . Not on file.   Social History Main Topics  . Smoking status: Former Smoker    Quit date: 04/03/2013  . Smokeless tobacco: Never Used  . Alcohol use No  . Drug use: No  . Sexual activity: Not Currently    Birth control/ protection: None   Other Topics Concern  . Not on file   Social History Narrative  . No narrative on file   No current facility-administered medications on file prior to encounter.    Current Outpatient Prescriptions on File Prior to Encounter  Medication Sig Dispense Refill  . acetaminophen (TYLENOL) 325 MG tablet Take 650 mg by mouth every 6 (six) hours as needed for moderate pain.     .  Prenatal Vit-Fe Fumarate-FA (PRENATAL MULTIVITAMIN) TABS tablet Take 1 tablet by mouth daily.      No Known Allergies  I have reviewed the past Medical Hx, Surgical Hx, Social Hx, Allergies and Medications.   Review of Systems  Constitutional: Negative for chills and fever.  Gastrointestinal: Positive for abdominal pain. Negative for nausea and vomiting.  Genitourinary: Positive for vaginal bleeding. Negative for dysuria and vaginal discharge.  Neurological: Positive for dizziness.    OBJECTIVE Patient Vitals for the past 24 hrs:  BP Temp Temp src Pulse Resp SpO2  03/17/16 0303 112/81 - - 89 16 -  03/16/16 2320 119/85 98.4 F (36.9 C) Oral 103 17 98 %   Constitutional: Well-developed, well-nourished female in Mild distress.  Cardiovascular: Mild tachycardia Respiratory: normal rate and effort.  GI: Abd soft, non-tender.  MS: Extremities nontender, no edema, normal ROM Neurologic: Alert and oriented x 4.  GU: Neg CVAT.  SPECULUM EXAM: NEFG, mod-large amount of bright red blood and clots an in vaginal vault. GS in cervix, removed w/ ring forceps, cervix clean visually 2-3 cm dilated. EBL ~300 ml during pelvic exam.     LAB RESULTS Results for orders placed or performed during the hospital encounter of 03/16/16 (from the  past 24 hour(s))  CBC     Status: Abnormal   Collection Time: 03/16/16 11:45 PM  Result Value Ref Range   WBC 9.1 4.0 - 10.5 K/uL   RBC 4.05 3.87 - 5.11 MIL/uL   Hemoglobin 12.3 12.0 - 15.0 g/dL   HCT 40.934.4 (L) 81.136.0 - 91.446.0 %   MCV 84.9 78.0 - 100.0 fL   MCH 30.4 26.0 - 34.0 pg   MCHC 35.8 30.0 - 36.0 g/dL   RDW 78.212.5 95.611.5 - 21.315.5 %   Platelets 288 150 - 400 K/uL  Type and screen     Status: None   Collection Time: 03/16/16 11:45 PM  Result Value Ref Range   ABO/RH(D) A POS    Antibody Screen NEG    Sample Expiration 03/19/2016     IMAGING [place] Ob Comp Less 14 Wks  Result Date: 03/13/2016 CLINICAL DATA:  Vaginal bleeding. Estimated gestational age by  last menstrual period equals 11 weeks 4 days. EXAM: OBSTETRIC <14 WK US AND TRANSVAGINAL OB US TECHNIQUE: Both transabdominal and transvaginal ultrasound examinations were performed for complete evaluation of the gestation as well as the maternal uterus, adnexal regions, and pelvic cul-de-sac. Transvaginal technique was performed to assess early pregnancy. COMPARISON:  None. FINDINGS: Intrauterine gestational sac: Present Yolk sac:  Not identified Embryo:  Not identified MSD: 24  mm   7 w   2  d Subchorionic hemorrhage:  None visualized. Maternal uterus/adnexae: Normal ovaries.  No free fluid. IMPRESSION: Large gestational sac without embryo or yolk sac. Mean sac diameter of this size without embryo is suspicious for non viability. Additionally, mean sac diameter is discordant with dates. Findings are suspicious but not yet definitive for failed pregnancy. Recommend follow-up US in 10-14 days for definitive diagnosis. This recommendation follows SRU consensus guidelines: Diagnostic Criteria for Nonviable Pregnancy Early in the First Trimester. Malva Limes Engl J Med 2013; [id number]:[id number]; 369:1443-51. Electronically Signed   By: Genevive BiStewart  Edmunds M.D.   On: 03/13/2016 15:00   [place] Ob Transvaginal  Result Date: 03/13/2016 CLINICAL DATA:  Vaginal bleeding. Estimated gestational age by last menstrual period equals 11 weeks 4 days. EXAM: OBSTETRIC <14 WK US AND TRANSVAGINAL OB US TECHNIQUE: Both transabdominal and transvaginal ultrasound examinations were performed for complete evaluation of the gestation as well as the maternal uterus, adnexal regions, and pelvic cul-de-sac. Transvaginal technique was performed to assess early pregnancy. COMPARISON:  None. FINDINGS: Intrauterine gestational sac: Present Yolk sac:  Not identified Embryo:  Not identified MSD: 24  mm   7 w   2  d Subchorionic hemorrhage:  None visualized. Maternal uterus/adnexae: Normal ovaries.  No free fluid. IMPRESSION: Large gestational sac without embryo or yolk sac. Mean sac  diameter of this size without embryo is suspicious for non viability. Additionally, mean sac diameter is discordant with dates. Findings are suspicious but not yet definitive for failed pregnancy. Recommend follow-up US in 10-14 days for definitive diagnosis. This recommendation follows SRU consensus guidelines: Diagnostic Criteria for Nonviable Pregnancy Early in the First Trimester. Malva Limes Engl J Med 2013; [id number]; 369:1443-51. Electronically Signed   By: Genevive BiStewart  Edmunds M.D.   On: 03/13/2016 15:00    MAU COURSE Orders Placed This Encounter  Procedures  . US OB Transvaginal  . CBC  . Apply heat to affected area  . Type and screen  IV bolus Dilaudid, Toradol  Bleeding, cramping much better after GS removed.   MDM - Completed SAB w/ episode of heavy bleeding, now stable after POCs passed. Hemodynamically stable.  ASSESSMENT 1. Complete miscarriage   2. Vaginal bleeding in pregnancy, first trimester    PLAN Discharge home in stable condition. Bleeding, SAB Precautions Support given Offered Chaplain, declined Follow-up Information    [facility] Follow up in 2 week(s).   Specialty:  Obstetrics and Gynecology Why:  for follow-up appointment Contact information: [address] [place] [address] [phone]       [facility]  MATERNITY ADMISSIONS .   Why:  as needed in emergencies Contact information: [address] [id number] mc [place] [address] [phone]           Medication List    TAKE these medications   acetaminophen 325 MG tablet Commonly known as:  TYLENOL Take 650 mg by mouth every 6 (six) hours as needed for moderate pain.   ibuprofen 600 MG tablet Commonly known as:  ADVIL,MOTRIN Take 1 tablet (600 mg total) by mouth every 6 (six) hours as needed.   prenatal multivitamin Tabs tablet Take 1 tablet by mouth daily.        [place], CNM 03/17/2016  3:18  AM

## 2016-03-17 DIAGNOSIS — O039 Complete or unspecified spontaneous abortion without complication: Secondary | ICD-10-CM

## 2016-03-17 LAB — CBC
HCT: 34.4 % — ABNORMAL LOW (ref 36.0–46.0)
Hemoglobin: 12.3 g/dL (ref 12.0–15.0)
MCH: 30.4 pg (ref 26.0–34.0)
MCHC: 35.8 g/dL (ref 30.0–36.0)
MCV: 84.9 fL (ref 78.0–100.0)
PLATELETS: 288 10*3/uL (ref 150–400)
RBC: 4.05 MIL/uL (ref 3.87–5.11)
RDW: 12.5 % (ref 11.5–15.5)
WBC: 9.1 10*3/uL (ref 4.0–10.5)

## 2016-03-17 LAB — TYPE AND SCREEN
ABO/RH(D): A POS
Antibody Screen: NEGATIVE

## 2016-03-17 MED ORDER — KETOROLAC TROMETHAMINE 30 MG/ML IJ SOLN
30.0000 mg | Freq: Once | INTRAMUSCULAR | Status: AC
  Administered 2016-03-17: 30 mg via INTRAVENOUS
  Filled 2016-03-17: qty 1

## 2016-03-17 MED ORDER — IBUPROFEN 600 MG PO TABS: 600.0000 mg | ORAL_TABLET | Freq: Four times a day (QID) | ORAL | 1 refills | Status: DC | PRN

## 2016-03-17 NOTE — Discharge Instructions (Signed)
Miscarriage  A miscarriage is the sudden loss of an unborn baby (fetus) before the 20th week of pregnancy. Most miscarriages happen in the first 3 months of pregnancy. Sometimes, it happens before a woman even knows she is pregnant. A miscarriage is also called a "spontaneous miscarriage" or "early pregnancy loss." Having a miscarriage can be an emotional experience. Talk with your caregiver about any questions you may have about miscarrying, the grieving process, and your future pregnancy plans.  CAUSES    Problems with the fetal chromosomes that make it impossible for the baby to develop normally. Problems with the baby's genes or chromosomes are most often the result of errors that occur, by chance, as the embryo divides and grows. The problems are not inherited from the parents.   Infection of the cervix or uterus.    Hormone problems.    Problems with the cervix, such as having an incompetent cervix. This is when the tissue in the cervix is not strong enough to hold the pregnancy.    Problems with the uterus, such as an abnormally shaped uterus, uterine fibroids, or congenital abnormalities.    Certain medical conditions.    Smoking, drinking alcohol, or taking illegal drugs.    Trauma.   Often, the cause of a miscarriage is unknown.   SYMPTOMS    Vaginal bleeding or spotting, with or without cramps or pain.   Pain or cramping in the abdomen or lower back.   Passing fluid, tissue, or blood clots from the vagina.  DIAGNOSIS   Your caregiver will perform a physical exam. You may also have an ultrasound to confirm the miscarriage. Blood or urine tests may also be ordered.  TREATMENT    Sometimes, treatment is not necessary if you naturally pass all the fetal tissue that was in the uterus. If some of the fetus or placenta remains in the body (incomplete miscarriage), tissue left behind may become infected and must be removed. Usually, a dilation and curettage (D and C) procedure is performed.  During a D and C procedure, the cervix is widened (dilated) and any remaining fetal or placental tissue is gently removed from the uterus.   Antibiotic medicines are prescribed if there is an infection. Other medicines may be given to reduce the size of the uterus (contract) if there is a lot of bleeding.   If you have Rh negative blood and your baby was Rh positive, you will need a Rh immunoglobulin shot. This shot will protect any future baby from having Rh blood problems in future pregnancies.  HOME CARE INSTRUCTIONS    Your caregiver may order bed rest or may allow you to continue light activity. Resume activity as directed by your caregiver.   Have someone help with home and family responsibilities during this time.    Keep track of the number of sanitary pads you use each day and how soaked (saturated) they are. Write down this information.    Do not use tampons. Do not douche or have sexual intercourse until approved by your caregiver.    Only take over-the-counter or prescription medicines for pain or discomfort as directed by your caregiver.    Do not take aspirin. Aspirin can cause bleeding.    Keep all follow-up appointments with your caregiver.    If you or your partner have problems with grieving, talk to your caregiver or seek counseling to help cope with the pregnancy loss. Allow enough time to grieve before trying to get pregnant again.     SEEK IMMEDIATE MEDICAL CARE IF:    You have severe cramps or pain in your back or abdomen.   You have a fever.   You pass large blood clots (walnut-sized or larger) ortissue from your vagina. Save any tissue for your caregiver to inspect.    Your bleeding increases.    You have a thick, bad-smelling vaginal discharge.   You become lightheaded, weak, or you faint.    You have chills.   MAKE SURE YOU:   Understand these instructions.   Will watch your condition.   Will get help right away if you are not doing well or get worse.     This  information is not intended to replace advice given to you by your health care provider. Make sure you discuss any questions you have with your health care provider.     Document Released: 12/08/2000 Document Revised: 10/09/2012 Document Reviewed: 08/03/2011  Elsevier Interactive Patient Education 2016 Elsevier Inc.

## 2016-03-29 ENCOUNTER — Ambulatory Visit: Payer: Self-pay | Admitting: Clinical

## 2016-03-29 ENCOUNTER — Encounter: Payer: Self-pay | Admitting: Family Medicine

## 2016-03-29 ENCOUNTER — Ambulatory Visit (INDEPENDENT_AMBULATORY_CARE_PROVIDER_SITE_OTHER): Payer: Self-pay | Admitting: Family Medicine

## 2016-03-29 VITALS — BP 117/79 | HR 72 | Ht 66.0 in | Wt 215.0 lb

## 2016-03-29 DIAGNOSIS — O039 Complete or unspecified spontaneous abortion without complication: Secondary | ICD-10-CM

## 2016-03-29 DIAGNOSIS — F4323 Adjustment disorder with mixed anxiety and depressed mood: Secondary | ICD-10-CM

## 2016-03-29 NOTE — BH Specialist Note (Signed)
  ASSESSMENT: Pt currently experiencing Adjustment disorder with mixed anxious and depressed mood. [facility] Pt would benefit from psychoeducation and brief therapeutic intervention.  Stage of Change: contemplative  PLAN: 1. F/U with behavioral health clinician in two weeks 2. Psychiatric Medications: none current  3. Behavioral recommendations:   -Read educational materials regarding coping with symptoms of anxiety and depression -Consider community resources for uninsured, as discussed in office visit   SUBJECTIVE: Pt. referred by Dr Jen MowElizabeth Mumaw for symptoms of anxiety and depression:  Pt. reports the following symptoms/concerns: Pt states that [facility], and has currently been unmedicated for over two months, because she was unable to pay for them while uninsured; pt concerned that since recent miscarriage, she will lose Medicaid, and unsure whether symptoms of depression and anxiety are result of hormonal changes or lack of meds. Duration of problem: two weeks Severity: severe   OBJECTIVE: Orientation & Cognition: Oriented x3. Thought processes normal and appropriate to situation. Mood: appropriate Affect: appropriate Appearance: appropriate Risk of harm to self or others: no known risk of harm to self or others Substance use: none Assessments administered: PHQ9: 20/GAD7: 20  Diagnosis: Adjustment disorder with anxious and depressed mood CPT Code: F43.23  -------------------------------------------- Other(s) present in the room: none  Time spent with patient in exam room: 15 minutes, 4:35-4:50pm Visit number: 1  [facility] 03/29/2016 09/20/2013  Decreased Interest 3 0  Down, Depressed, Hopeless 3 0  PHQ - 2 Score 6 0  Altered sleeping 3 -  Tired, decreased energy 3 -  Change in appetite 3 -  Feeling bad or failure about yourself  1 -  Trouble concentrating 3 -  Moving slowly or fidgety/restless 1 -   Suicidal thoughts 0 -  PHQ-9 Score 20 -   GAD 7 : Generalized Anxiety Score 03/29/2016  Nervous, Anxious, on Edge 3  Control/stop worrying 3  Worry too much - different things 3  Trouble relaxing 3  Restless 2  Easily annoyed or irritable 3  Afraid - awful might happen 3  Total GAD 7 Score 20

## 2016-03-29 NOTE — Patient Instructions (Signed)
Miscarriage  A miscarriage is the sudden loss of an unborn baby (fetus) before the 20th week of pregnancy. Most miscarriages happen in the first 3 months of pregnancy. Sometimes, it happens before a woman even knows she is pregnant. A miscarriage is also called a "spontaneous miscarriage" or "early pregnancy loss." Having a miscarriage can be an emotional experience. Talk with your caregiver about any questions you may have about miscarrying, the grieving process, and your future pregnancy plans.  CAUSES    Problems with the fetal chromosomes that make it impossible for the baby to develop normally. Problems with the baby's genes or chromosomes are most often the result of errors that occur, by chance, as the embryo divides and grows. The problems are not inherited from the parents.   Infection of the cervix or uterus.    Hormone problems.    Problems with the cervix, such as having an incompetent cervix. This is when the tissue in the cervix is not strong enough to hold the pregnancy.    Problems with the uterus, such as an abnormally shaped uterus, uterine fibroids, or congenital abnormalities.    Certain medical conditions.    Smoking, drinking alcohol, or taking illegal drugs.    Trauma.   Often, the cause of a miscarriage is unknown.   SYMPTOMS    Vaginal bleeding or spotting, with or without cramps or pain.   Pain or cramping in the abdomen or lower back.   Passing fluid, tissue, or blood clots from the vagina.  DIAGNOSIS   Your caregiver will perform a physical exam. You may also have an ultrasound to confirm the miscarriage. Blood or urine tests may also be ordered.  TREATMENT    Sometimes, treatment is not necessary if you naturally pass all the fetal tissue that was in the uterus. If some of the fetus or placenta remains in the body (incomplete miscarriage), tissue left behind may become infected and must be removed. Usually, a dilation and curettage (D and C) procedure is performed.  During a D and C procedure, the cervix is widened (dilated) and any remaining fetal or placental tissue is gently removed from the uterus.   Antibiotic medicines are prescribed if there is an infection. Other medicines may be given to reduce the size of the uterus (contract) if there is a lot of bleeding.   If you have Rh negative blood and your baby was Rh positive, you will need a Rh immunoglobulin shot. This shot will protect any future baby from having Rh blood problems in future pregnancies.  HOME CARE INSTRUCTIONS    Your caregiver may order bed rest or may allow you to continue light activity. Resume activity as directed by your caregiver.   Have someone help with home and family responsibilities during this time.    Keep track of the number of sanitary pads you use each day and how soaked (saturated) they are. Write down this information.    Do not use tampons. Do not douche or have sexual intercourse until approved by your caregiver.    Only take over-the-counter or prescription medicines for pain or discomfort as directed by your caregiver.    Do not take aspirin. Aspirin can cause bleeding.    Keep all follow-up appointments with your caregiver.    If you or your partner have problems with grieving, talk to your caregiver or seek counseling to help cope with the pregnancy loss. Allow enough time to grieve before trying to get pregnant again.     SEEK IMMEDIATE MEDICAL CARE IF:    You have severe cramps or pain in your back or abdomen.   You have a fever.   You pass large blood clots (walnut-sized or larger) ortissue from your vagina. Save any tissue for your caregiver to inspect.    Your bleeding increases.    You have a thick, bad-smelling vaginal discharge.   You become lightheaded, weak, or you faint.    You have chills.   MAKE SURE YOU:   Understand these instructions.   Will watch your condition.   Will get help right away if you are not doing well or get worse.     This  information is not intended to replace advice given to you by your health care provider. Make sure you discuss any questions you have with your health care provider.     Document Released: 12/08/2000 Document Revised: 10/09/2012 Document Reviewed: 08/03/2011  Elsevier Interactive Patient Education 2016 Elsevier Inc.

## 2016-03-29 NOTE — Progress Notes (Signed)
CLINIC ENCOUNTER NOTE  History:  30 y.o. G2P1001 here today for completed spontaneous abortion.  Undesired pregnancy initially. Was on nuvaring, but missed next insertion 2/2 lost insurance, got pregnant. Blighted ovum. Passed tissue with heavy bleeding.   Desires NuvaRing again. Recent unprotected intercourse was 2 days ago.  Past Medical History:  Diagnosis Date  . ADHD (attention deficit hyperactivity disorder)   . Depression   . Headache     No past surgical history on file.  The following portions of the patient's history were reviewed and updated as appropriate: allergies, current medications, past family history, past medical history, past social history, past surgical history and problem list.    Review of Systems:  See above; comprehensive review of systems was otherwise negative.  Objective:  Physical Exam BP 117/79   Pulse 72   Ht 5\' 6"  (1.676 m)   Wt 215 lb (97.5 kg)   LMP 12/23/2015   BMI 34.70 kg/m    CONSTITUTIONAL: Well-developed, well-nourished female in no acute distress.  HENT:  Normocephalic, atraumatic SKIN: Skin is warm and dry.  NEUROLGIC: Alert, CN II-XII grossly intact  PSYCHIATRIC: Normal mood and affect. Appropriately grieving/tearful. CARDIOVASCULAR: Normal heart rate noted RESPIRATORY: Effort and breath sounds normal, no problems with respiration noted ABDOMEN: Soft, no distention noted.  No tenderness, rebound or guarding.     Labs and Imaging [place] Ob Comp Less 14 Wks  Result Date: 03/13/2016 CLINICAL DATA:  Vaginal bleeding. Estimated gestational age by last menstrual period equals 11 weeks 4 days. EXAM: OBSTETRIC <14 WK US AND TRANSVAGINAL OB US TECHNIQUE: Both transabdominal and transvaginal ultrasound examinations were performed for complete evaluation of the gestation as well as the maternal uterus, adnexal regions, and pelvic cul-de-sac. Transvaginal technique was performed to assess early pregnancy. COMPARISON:  None. FINDINGS:  Intrauterine gestational sac: Present Yolk sac:  Not identified Embryo:  Not identified MSD: 24  mm   7 w   2  d Subchorionic hemorrhage:  None visualized. Maternal uterus/adnexae: Normal ovaries.  No free fluid. IMPRESSION: Large gestational sac without embryo or yolk sac. Mean sac diameter of this size without embryo is suspicious for non viability. Additionally, mean sac diameter is discordant with dates. Findings are suspicious but not yet definitive for failed pregnancy. Recommend follow-up US in 10-14 days for definitive diagnosis. This recommendation follows SRU consensus guidelines: Diagnostic Criteria for Nonviable Pregnancy Early in the First Trimester. Malva Limes Engl J Med 2013; 161:0960-45; 369:1443-51. Electronically Signed   By: Genevive BiStewart  Edmunds M.D.   On: 03/13/2016 15:00   [place] Ob Transvaginal  Result Date: 03/13/2016 CLINICAL DATA:  Vaginal bleeding. Estimated gestational age by last menstrual period equals 11 weeks 4 days. EXAM: OBSTETRIC <14 WK US AND TRANSVAGINAL OB US TECHNIQUE: Both transabdominal and transvaginal ultrasound examinations were performed for complete evaluation of the gestation as well as the maternal uterus, adnexal regions, and pelvic cul-de-sac. Transvaginal technique was performed to assess early pregnancy. COMPARISON:  None. FINDINGS: Intrauterine gestational sac: Present Yolk sac:  Not identified Embryo:  Not identified MSD: 24  mm   7 w   2  d Subchorionic hemorrhage:  None visualized. Maternal uterus/adnexae: Normal ovaries.  No free fluid. IMPRESSION: Large gestational sac without embryo or yolk sac. Mean sac diameter of this size without embryo is suspicious for non viability. Additionally, mean sac diameter is discordant with dates. Findings are suspicious but not yet definitive for failed pregnancy. Recommend follow-up US in 10-14 days for definitive diagnosis. This recommendation follows SRU consensus  guidelines: Diagnostic Criteria for Nonviable Pregnancy Early in the First Trimester.  Malva Limes Med 2013; 161:0960-45. Electronically Signed   By: Genevive Bi M.D.   On: 03/13/2016 15:00    SURGICAL PATHOLOGY: POC 03/17/16  CHORIONIC VILLI ARE PRESENT AND APPROPRIATELY DEVELOPED FRAGMENTS OF GESTATIONAL SAC DECIDUALIZED ENDOMETRIUM    Assessment & Plan:   1. Complete abortion - hCG, quantitative, pregnancy - 2 days ago, unprotected sex; will return in 2 weeks for preg test, if negative, will send for Nuva Ring. Reviewed all forms of contraception.  Routine preventative health maintenance measures emphasized.     Jen Mow, DO OB/GYN Fellow Center for [employer], [facility]

## 2016-03-29 NOTE — Progress Notes (Signed)
States since Wednesday has been spotting mostly, some cramps and light bleeding.

## 2016-03-30 LAB — HCG, QUANTITATIVE, PREGNANCY: hCG, Beta Chain, Quant, S: 13.8 m[IU]/mL — ABNORMAL HIGH

## 2016-06-28 NOTE — L&D Delivery Note (Signed)
Delivery Note Patient was delivered by Staff Attending Dr. Doroteo GlassmanPhelps while I was attending another delivery I was present for repair of her laceration. She pushed for less than 10 minutes and at 6:48 AM a viable and healthy female was delivered via Vaginal, Spontaneous Delivery (Presentation: ROA  ).  APGAR: 7, 9; weight pending. Cord was double clamped and cut and the placenta was spontaneously delivered intact, 3 vessels.  There was uterine atony that was alleviated by massage and IV pitocin.  1st degree perineal laceration was repaired with 2-0 vicryl and 3-0 chromic. Bilateral labial lacerations were bleeding and were repaired with running locked sutures of 3-0 chromic.  Patient tolerated delivery well and remained hemodynamically stable during the delivery.   Anesthesia:  Epidural Episiotomy:  none Lacerations: 1st degree;Labial Suture Repair: 2.0 vicryl 3-0 chromic Est. Blood Loss (mL): 600  Mom to postpartum.  Baby to Couplet care / Skin to Skin.  Essie HartINN, Johnney Scarlata STACIA 03/01/2017, 7:47 AM

## 2016-08-09 ENCOUNTER — Other Ambulatory Visit: Payer: Self-pay | Admitting: Obstetrics & Gynecology

## 2016-08-09 LAB — OB RESULTS CONSOLE ABO/RH: RH TYPE: POSITIVE

## 2016-08-09 LAB — OB RESULTS CONSOLE RPR: RPR: NONREACTIVE

## 2016-08-09 LAB — OB RESULTS CONSOLE HIV ANTIBODY (ROUTINE TESTING): HIV: NONREACTIVE

## 2016-08-09 LAB — OB RESULTS CONSOLE HEPATITIS B SURFACE ANTIGEN: Hepatitis B Surface Ag: NEGATIVE

## 2016-08-09 LAB — OB RESULTS CONSOLE RUBELLA ANTIBODY, IGM: RUBELLA: IMMUNE

## 2016-08-09 LAB — OB RESULTS CONSOLE ANTIBODY SCREEN: ANTIBODY SCREEN: NEGATIVE

## 2017-01-16 ENCOUNTER — Encounter (HOSPITAL_COMMUNITY): Payer: Self-pay

## 2017-02-08 LAB — OB RESULTS CONSOLE GBS: GBS: NEGATIVE

## 2017-02-28 ENCOUNTER — Encounter (HOSPITAL_COMMUNITY): Payer: Self-pay | Admitting: *Deleted

## 2017-02-28 ENCOUNTER — Inpatient Hospital Stay (HOSPITAL_COMMUNITY)
Admission: AD | Admit: 2017-02-28 | Discharge: 2017-03-02 | DRG: 775 | Disposition: A | Discharge status: Home / Self Care | Payer: BLUE CROSS/BLUE SHIELD | Source: Ambulatory Visit | Attending: Obstetrics & Gynecology | Admitting: Obstetrics & Gynecology

## 2017-02-28 DIAGNOSIS — O3663X0 Maternal care for excessive fetal growth, third trimester, not applicable or unspecified: Secondary | ICD-10-CM | POA: Diagnosis present

## 2017-02-28 DIAGNOSIS — Z87891 Personal history of nicotine dependence: Secondary | ICD-10-CM

## 2017-02-28 DIAGNOSIS — Z3A39 39 weeks gestation of pregnancy: Secondary | ICD-10-CM | POA: Diagnosis not present

## 2017-02-28 DIAGNOSIS — O403XX0 Polyhydramnios, third trimester, not applicable or unspecified: Principal | ICD-10-CM | POA: Diagnosis present

## 2017-02-28 DIAGNOSIS — Z3490 Encounter for supervision of normal pregnancy, unspecified, unspecified trimester: Secondary | ICD-10-CM

## 2017-02-28 LAB — CBC
HCT: 37.3 % (ref 36.0–46.0)
Hemoglobin: 12.9 g/dL (ref 12.0–15.0)
MCH: 29.2 pg (ref 26.0–34.0)
MCHC: 34.6 g/dL (ref 30.0–36.0)
MCV: 84.4 fL (ref 78.0–100.0)
PLATELETS: 222 10*3/uL (ref 150–400)
RBC: 4.42 MIL/uL (ref 3.87–5.11)
RDW: 13.7 % (ref 11.5–15.5)
WBC: 9.6 10*3/uL (ref 4.0–10.5)

## 2017-02-28 LAB — TYPE AND SCREEN
ABO/RH(D): A POS
Antibody Screen: NEGATIVE

## 2017-02-28 MED ORDER — ONDANSETRON HCL 4 MG/2ML IJ SOLN
4.0000 mg | Freq: Four times a day (QID) | INTRAMUSCULAR | Status: DC | PRN
  Administered 2017-03-01: 4 mg via INTRAVENOUS
  Filled 2017-02-28: qty 2

## 2017-02-28 MED ORDER — TERBUTALINE SULFATE 1 MG/ML IJ SOLN
0.2500 mg | Freq: Once | INTRAMUSCULAR | Status: DC | PRN
  Filled 2017-02-28: qty 1

## 2017-02-28 MED ORDER — OXYTOCIN 40 UNITS IN LACTATED RINGERS INFUSION - SIMPLE MED
1.0000 m[IU]/min | INTRAVENOUS | Status: DC
  Administered 2017-02-28: 2 m[IU]/min via INTRAVENOUS
  Filled 2017-02-28: qty 1000

## 2017-02-28 MED ORDER — OXYTOCIN BOLUS FROM INFUSION
500.0000 mL | Freq: Once | INTRAVENOUS | Status: AC
  Administered 2017-03-01: 500 mL via INTRAVENOUS

## 2017-02-28 MED ORDER — ACETAMINOPHEN 325 MG PO TABS: 650.0000 mg | ORAL_TABLET | ORAL | Status: DC | PRN

## 2017-02-28 MED ORDER — OXYCODONE-ACETAMINOPHEN 5-325 MG PO TABS: 1.0000 | ORAL_TABLET | ORAL | Status: DC | PRN

## 2017-02-28 MED ORDER — LACTATED RINGERS IV SOLN
INTRAVENOUS | Status: DC
  Administered 2017-02-28 – 2017-03-01 (×2): via INTRAVENOUS

## 2017-02-28 MED ORDER — OXYCODONE-ACETAMINOPHEN 5-325 MG PO TABS: 2.0000 | ORAL_TABLET | ORAL | Status: DC | PRN

## 2017-02-28 MED ORDER — LACTATED RINGERS IV SOLN
500.0000 mL | INTRAVENOUS | Status: DC | PRN
  Administered 2017-03-01: 1000 mL via INTRAVENOUS

## 2017-02-28 MED ORDER — OXYTOCIN 40 UNITS IN LACTATED RINGERS INFUSION - SIMPLE MED
2.5000 [IU]/h | INTRAVENOUS | Status: DC
  Administered 2017-03-01: 2.5 [IU]/h via INTRAVENOUS

## 2017-02-28 MED ORDER — LIDOCAINE HCL (PF) 1 % IJ SOLN
30.0000 mL | INTRAMUSCULAR | Status: DC | PRN
  Filled 2017-02-28: qty 30

## 2017-02-28 MED ORDER — SOD CITRATE-CITRIC ACID 500-334 MG/5ML PO SOLN: 30.0000 mL | ORAL | Status: DC | PRN

## 2017-02-28 NOTE — Anesthesia Pain Management Evaluation Note (Signed)
  CRNA Pain Management Visit Note  Patient: Megan KayRuqayyah Skiver, 31 y.o., female  "Hello I am a member of the anesthesia team at [facility]. We have an anesthesia team available at all times to provide care throughout the hospital, including epidural management and anesthesia for C-section. I don't know your plan for the delivery whether it a natural birth, water birth, IV sedation, nitrous supplementation, doula or epidural, but we want to meet your pain goals."   1.Was your pain managed to your expectations on prior hospitalizations?   Yes   2.What is your expectation for pain management during this hospitalization?     Epidural  3.How can we help you reach that goal?   Record the patient's initial score and the patient's pain goal.   Pain: 0  Pain Goal: 7 The [facility] wants you to be able to say your pain was always managed very well.  Laban EmperorMalinova,Joaopedro Eschbach Hristova 02/28/2017

## 2017-02-28 NOTE — H&P (Signed)
Megan SenegalRuqayyah Karie Reese is a 31 y.o. female G3P1011 at 4739 weeks 1 day presenting for IOL for suspected fetal macrosomia and polyhydramnios.  Last US showed 9#13oz. Patient w/o ctx, no LOF, no VB. Notes good fetal movement.   OB History    Gravida Para Term Preterm AB Living   3 1 1     1    SAB TAB Ectopic Multiple Live Births           1     Past Medical History:  Diagnosis Date  . ADHD (attention deficit hyperactivity disorder)   . Depression   . Headache    History reviewed. No pertinent surgical history. Family History: family history includes Diabetes in her mother. Social History:  reports that she quit smoking about 3 years ago. She has never used smokeless tobacco. She reports that she does not drink alcohol or use drugs.     Maternal Diabetes: No Genetic Screening: Normal Maternal Ultrasounds/Referrals: Normal Fetal Ultrasounds or other Referrals:  None Maternal Substance Abuse:  No Significant Maternal Medications:  None Significant Maternal Lab Results:  Lab values include: Group B Strep negative Other Comments:  None  Review of Systems  Constitutional: Negative.   HENT: Negative.   Eyes: Negative.   Cardiovascular: Negative.   Gastrointestinal: Negative.   Skin: Negative.   All other systems reviewed and are negative.  Maternal Medical History:  Contractions: Frequency: rare.   Perceived severity is mild.    Fetal activity: Perceived fetal activity is normal.   Last perceived fetal movement was within the past hour.    Prenatal complications: Polyhydramnios.   Prenatal Complications - Diabetes: none.      unknown if currently breastfeeding. Maternal Exam:  Uterine Assessment: Contraction strength is mild.  Contraction frequency is rare.   Abdomen: Patient reports no abdominal tenderness. Fundal height is 41 cm.   Estimated fetal weight is 4850 grams.   Fetal presentation: vertex  Introitus: Normal vulva. Normal vagina.  Ferning test: not done.  Nitrazine  test: not done. Amniotic fluid character: not assessed.  Pelvis: of concern for delivery.      Fetal Exam Fetal Monitor Review: Baseline rate: 140.  Variability: moderate (6-25 bpm).   Pattern: accelerations present and no decelerations.    Fetal State Assessment: Category I - tracings are normal.     Physical Exam  Nursing note and vitals reviewed.   Prenatal labs: ABO, Rh: A/Positive/-- (02/12 0000) Antibody: Negative (02/12 0000) Rubella: Immune (02/12 0000) RPR: Nonreactive (02/12 0000)  HBsAg: Negative (02/12 0000)  HIV: Non-reactive (02/12 0000)  GBS: Negative (08/14 0000)   Assessment/Plan: 31 yo G3P1011 at 39 weeks 1 day with suspected macrosomia.  Last baby 8# 6oz Admit to Labor and Delivery Pitocin IV for induction of labor Continuous monitoring Epidural on demand   Demecia Northway STACIA 02/28/2017, 1:58 PM

## 2017-03-01 ENCOUNTER — Encounter (HOSPITAL_COMMUNITY): Payer: Self-pay

## 2017-03-01 ENCOUNTER — Inpatient Hospital Stay (HOSPITAL_COMMUNITY): Payer: BLUE CROSS/BLUE SHIELD | Admitting: Anesthesiology

## 2017-03-01 LAB — RPR: RPR Ser Ql: NONREACTIVE

## 2017-03-01 MED ORDER — FENTANYL 2.5 MCG/ML BUPIVACAINE 1/10 % EPIDURAL INFUSION (WH - ANES)
14.0000 mL/h | INTRAMUSCULAR | Status: DC | PRN
  Administered 2017-03-01: 14 mL/h via EPIDURAL
  Filled 2017-03-01: qty 100

## 2017-03-01 MED ORDER — METHYLERGONOVINE MALEATE 0.2 MG PO TABS: 0.2000 mg | ORAL_TABLET | ORAL | Status: DC | PRN

## 2017-03-01 MED ORDER — METHYLERGONOVINE MALEATE 0.2 MG/ML IJ SOLN: 0.2000 mg | INTRAMUSCULAR | Status: DC | PRN

## 2017-03-01 MED ORDER — OXYTOCIN 40 UNITS IN LACTATED RINGERS INFUSION - SIMPLE MED: 2.5000 [IU]/h | INTRAVENOUS | Status: DC | PRN

## 2017-03-01 MED ORDER — SENNOSIDES-DOCUSATE SODIUM 8.6-50 MG PO TABS
2.0000 | ORAL_TABLET | ORAL | Status: DC
  Administered 2017-03-01: 2 via ORAL
  Filled 2017-03-01: qty 2

## 2017-03-01 MED ORDER — EPHEDRINE 5 MG/ML INJ
10.0000 mg | INTRAVENOUS | Status: DC | PRN
  Filled 2017-03-01: qty 2

## 2017-03-01 MED ORDER — DIPHENHYDRAMINE HCL 25 MG PO CAPS: 25.0000 mg | ORAL_CAPSULE | Freq: Four times a day (QID) | ORAL | Status: DC | PRN

## 2017-03-01 MED ORDER — LIDOCAINE HCL (PF) 1 % IJ SOLN
INTRAMUSCULAR | Status: DC | PRN
  Administered 2017-03-01 (×2): 5 mL via EPIDURAL

## 2017-03-01 MED ORDER — PHENYLEPHRINE 40 MCG/ML (10ML) SYRINGE FOR IV PUSH (FOR BLOOD PRESSURE SUPPORT)
80.0000 ug | PREFILLED_SYRINGE | INTRAVENOUS | Status: DC | PRN
  Filled 2017-03-01: qty 5
  Filled 2017-03-01: qty 10

## 2017-03-01 MED ORDER — ACETAMINOPHEN 325 MG PO TABS: 650.0000 mg | ORAL_TABLET | ORAL | Status: DC | PRN

## 2017-03-01 MED ORDER — BENZOCAINE-MENTHOL 20-0.5 % EX AERO
1.0000 "application " | INHALATION_SPRAY | CUTANEOUS | Status: DC | PRN
  Administered 2017-03-01: 1 via TOPICAL
  Filled 2017-03-01: qty 56

## 2017-03-01 MED ORDER — ZOLPIDEM TARTRATE 5 MG PO TABS: 5.0000 mg | ORAL_TABLET | Freq: Every evening | ORAL | Status: DC | PRN

## 2017-03-01 MED ORDER — IBUPROFEN 600 MG PO TABS
600.0000 mg | ORAL_TABLET | Freq: Four times a day (QID) | ORAL | Status: DC
  Administered 2017-03-01 – 2017-03-02 (×5): 600 mg via ORAL
  Filled 2017-03-01 (×6): qty 1

## 2017-03-01 MED ORDER — DIBUCAINE 1 % RE OINT: 1.0000 "application " | TOPICAL_OINTMENT | RECTAL | Status: DC | PRN

## 2017-03-01 MED ORDER — COCONUT OIL OIL
1.0000 | TOPICAL_OIL | Status: DC | PRN
Start: 2017-03-01 — End: 2017-03-02

## 2017-03-01 MED ORDER — PRENATAL MULTIVITAMIN CH
1.0000 | ORAL_TABLET | Freq: Every day | ORAL | Status: DC
  Administered 2017-03-01 – 2017-03-02 (×2): 1 via ORAL
  Filled 2017-03-01 (×2): qty 1

## 2017-03-01 MED ORDER — PHENYLEPHRINE 40 MCG/ML (10ML) SYRINGE FOR IV PUSH (FOR BLOOD PRESSURE SUPPORT)
80.0000 ug | PREFILLED_SYRINGE | INTRAVENOUS | Status: DC | PRN
  Filled 2017-03-01: qty 5

## 2017-03-01 MED ORDER — OXYCODONE-ACETAMINOPHEN 5-325 MG PO TABS: 1.0000 | ORAL_TABLET | ORAL | Status: DC | PRN

## 2017-03-01 MED ORDER — TETANUS-DIPHTH-ACELL PERTUSSIS 5-2.5-18.5 LF-MCG/0.5 IM SUSP: 0.5000 mL | Freq: Once | INTRAMUSCULAR | Status: DC

## 2017-03-01 MED ORDER — LACTATED RINGERS IV SOLN: 500.0000 mL | Freq: Once | INTRAVENOUS | Status: DC

## 2017-03-01 MED ORDER — WITCH HAZEL-GLYCERIN EX PADS: 1.0000 "application " | MEDICATED_PAD | CUTANEOUS | Status: DC | PRN

## 2017-03-01 MED ORDER — DIPHENHYDRAMINE HCL 50 MG/ML IJ SOLN: 12.5000 mg | INTRAMUSCULAR | Status: DC | PRN

## 2017-03-01 MED ORDER — SIMETHICONE 80 MG PO CHEW: 80.0000 mg | CHEWABLE_TABLET | ORAL | Status: DC | PRN

## 2017-03-01 MED ORDER — ONDANSETRON HCL 4 MG/2ML IJ SOLN: 4.0000 mg | INTRAMUSCULAR | Status: DC | PRN

## 2017-03-01 MED ORDER — ONDANSETRON HCL 4 MG PO TABS: 4.0000 mg | ORAL_TABLET | ORAL | Status: DC | PRN

## 2017-03-01 MED ORDER — OXYCODONE-ACETAMINOPHEN 5-325 MG PO TABS
2.0000 | ORAL_TABLET | ORAL | Status: DC | PRN
Start: 2017-03-01 — End: 2017-03-02

## 2017-03-01 NOTE — Anesthesia Preprocedure Evaluation (Signed)

## 2017-03-01 NOTE — Progress Notes (Signed)
Megan Reese is a 31 y.o. G3P1001 at 6943w2d by LMP admitted for induction of labor due to Elective at term.  Subjective: Patient comfortable with epidural  Objective: BP 96/67   Pulse 97   Temp 98 F (36.7 C)   Resp 16   Ht 5\' 6"  (1.676 m)   Wt 95.3 kg (210 lb)   BMI 33.89 kg/m  No intake/output data recorded. No intake/output data recorded.  FHT:  FHR: 140 bpm, variability: moderate,  accelerations:  Present,  decelerations:  Present variable decel with contractions UC:   regular, every 1-2 minutes SVE:   Dilation: 6 Effacement (%): 80, 90 Station: -2 Exam by:: W. Seleny Allbright,MD  Labs: Lab Results  Component Value Date   WBC 9.6 02/28/2017   HGB 12.9 02/28/2017   HCT 37.3 02/28/2017   MCV 84.4 02/28/2017   PLT 222 02/28/2017    Assessment / Plan: Induction of labor due to term with favorable cervix,  progressing well on pitocin  Labor: Progressing on Pitocin, AROM performed large amount of blood tinged fluid Preeclampsia:  no signs or symptoms of toxicity Fetal Wellbeing:  Category II Pain Control:  Epidural I/D:  n/a Anticipated MOD:  NSVD  Megan Reese Megan Reese

## 2017-03-01 NOTE — Anesthesia Postprocedure Evaluation (Signed)
Anesthesia Post Note  Patient: Megan Reese  Procedure(s) Performed: * No procedures listed *     Patient location during evaluation: Mother Baby Anesthesia Type: Epidural Level of consciousness: awake and alert and oriented Pain management: satisfactory to patient Vital Signs Assessment: post-procedure vital signs reviewed and stable Respiratory status: respiratory function stable Cardiovascular status: stable Postop Assessment: no headache, no backache, epidural receding, patient able to bend at knees, no signs of nausea or vomiting and adequate PO intake Anesthetic complications: no    Last Vitals:  Vitals:   03/01/17 0835 03/01/17 0933  BP: 119/73 119/74  Pulse: 81 80  Resp: 16 16  Temp: 36.7 C 36.9 C    Last Pain:  Vitals:   03/01/17 1138  TempSrc:   PainSc: 0-No pain   Pain Goal:                 Megan Reese

## 2017-03-01 NOTE — Anesthesia Procedure Notes (Signed)
Epidural Patient location during procedure: OB Start time: 03/01/2017 2:10 AM End time: 03/01/2017 2:20 AM  Staffing Anesthesiologist: Dylin Breeden  Preanesthetic Checklist Completed: patient identified, site marked, surgical consent, pre-op evaluation, timeout performed, IV checked, risks and benefits discussed and monitors and equipment checked  Epidural Patient position: sitting Prep: site prepped and draped and DuraPrep Patient monitoring: continuous pulse ox and blood pressure Approach: midline Location: L3-L4 Injection technique: LOR air  Needle:  Needle type: Tuohy  Needle gauge: 17 G Needle length: 9 cm and 9 Needle insertion depth: 5 cm cm Catheter type: closed end flexible Catheter size: 19 Gauge Catheter at skin depth: 10 cm Test dose: negative  Assessment Events: blood not aspirated, injection not painful, no injection resistance, negative IV test and no paresthesia

## 2017-03-02 LAB — CBC
HCT: 32.9 % — ABNORMAL LOW (ref 36.0–46.0)
Hemoglobin: 11.2 g/dL — ABNORMAL LOW (ref 12.0–15.0)
MCH: 29.4 pg (ref 26.0–34.0)
MCHC: 34 g/dL (ref 30.0–36.0)
MCV: 86.4 fL (ref 78.0–100.0)
PLATELETS: 200 10*3/uL (ref 150–400)
RBC: 3.81 MIL/uL — ABNORMAL LOW (ref 3.87–5.11)
RDW: 14 % (ref 11.5–15.5)
WBC: 9.5 10*3/uL (ref 4.0–10.5)

## 2017-03-02 NOTE — Progress Notes (Signed)
CSW received consult due to score greater than 9, or positive for SI on [place] Depression Screen.   When CSW arrived, MOB was bonding with infant as evident by MOB breast feeding.  CSW assessed for safety and MOB denied SI and HI.  CSW also denied PPD with MOB's oldest child. MOB expressed feeling good and feeling prepared to parent.  MOB reports a wealth of support.  CSW provided education regarding Baby Blues vs PMADs and provided MOB with information about support groups held at [facility].  CSW encouraged MOB to evaluate her mental health throughout the postpartum period with the use of the New Mom Checklist developed by Postpartum Progress and notify a medical professional if symptoms arise.    There are no barriers to d/c.  Blaine HamperAngel Boyd-Gilyard, MSW, LCSW Clinical Social Work [phone]

## 2017-03-02 NOTE — Progress Notes (Signed)
Patient is eating, ambulating, voiding.  Pain control is good.  Vitals:   03/01/17 0933 03/01/17 1348 03/01/17 2135 03/02/17 0543  BP: 119/74 120/79 119/80 111/78  Pulse: 80 92 75 77  Resp: 16 16 18 18   Temp: 98.4 F (36.9 C) 98.4 F (36.9 C) 98.8 F (37.1 C) 97.9 F (36.6 C)  TempSrc: Oral Oral Oral Oral  Weight:      Height:        Fundus firm Perineum without swelling.  Lab Results  Component Value Date   WBC 9.5 03/02/2017   HGB 11.2 (L) 03/02/2017   HCT 32.9 (L) 03/02/2017   MCV 86.4 03/02/2017   PLT 200 03/02/2017    --/--/A POS (09/03 1351)/RI  A/P Post partum day 1.  Routine care.  Expect d/c routine.    Erisa Mehlman A

## 2017-03-02 NOTE — Lactation Note (Signed)
This note was copied from a baby's chart. Lactation Consultation Note  Patient Name: Megan Reese Megan Reese Reason for consult: Initial assessment Baby at 29 hr of life. Upon entry baby was sleeping in visitor's arms and mom was eating. She reports bf is "fine". She stated she told "them I don't need lactation". Left lactation handouts and suggested she call for help as needed. No bf education was done.   Maternal Data    Feeding Feeding Type: Breast Fed  LATCH Score Latch: Grasps breast easily, tongue down, lips flanged, rhythmical sucking.  Audible Swallowing: A few with stimulation  Type of Nipple: Everted at rest and after stimulation  Comfort (Breast/Nipple): Soft / non-tender  Hold (Positioning): No assistance needed to correctly position infant at breast.  LATCH Score: 9  Interventions    Lactation Tools Discussed/Used     Consult Status Consult Status: Complete    Megan Reese Reese, 12:19 PM

## 2017-03-02 NOTE — Discharge Summary (Signed)
Obstetric Discharge Summary Reason for Admission: induction of labor Prenatal Procedures: NST and ultrasound Intrapartum Procedures: spontaneous vaginal delivery Postpartum Procedures: none Complications-Operative and Postpartum: 1st degree labial laceration Hemoglobin  Date Value Ref Range Status  03/02/2017 11.2 (L) 12.0 - 15.0 g/dL Final   HCT  Date Value Ref Range Status  03/02/2017 32.9 (L) 36.0 - 46.0 % Final    Discharge Diagnoses: Term Pregnancy-delivered  Discharge Information: Date: 03/02/2017 Activity: pelvic rest Diet: routine Medications: Ibuprofen Condition: stable Instructions: refer to practice specific booklet Discharge to: home Follow-up Information    Essie HartPinn, Walda, MD Follow up in 4 week(s).   Specialty:  Obstetrics and Gynecology Contact information: [address] [place] [address] [phone]           Newborn Data: Live born female  Birth Weight: 8 lb 3.9 oz (3739 g) APGAR: 7, 9  Home with mother.  Zhania Shaheen A 03/02/2017, 7:49 AM

## 2017-03-02 NOTE — Progress Notes (Signed)
MOB was referred for history of depression/anxiety. * Referral screened out by Clinical Social Worker because none of the following criteria appear to apply: ~ History of anxiety/depression during this pregnancy, or of post-partum depression. ~ Diagnosis of anxiety and/or depression within last 3 years; A CSW met with MOB on 09/27/14 for the same consult and reported MH hx greater than 3 years.  * MOB's symptoms currently being treated with medication and/or therapy.  No concerns not in OB records.  Please contact the Clinical Social Worker if needs arise, by [facility] request, or if MOB scores 9 or greater/yes to question 10 on [place] Postpartum Depression Screen.  Laurey Arrow, MSW, LCSW Clinical Social Work [phone]

## 2017-03-03 ENCOUNTER — Ambulatory Visit: Payer: Self-pay

## 2017-03-03 NOTE — Lactation Note (Signed)
This note was copied from a baby's chart. Lactation Consultation Note Baby pt. D/t elevated bili. Baby BF well. Cluster fed, breast filling. Reviewed I&O, STS, supply and demand, engorgement,clogged ducts, milk storage, pumping, and breast care. Encouraged to monitor for jaundice, sleepiness, not interested in feeding, stimulating baby, and calling Dr. If needed. Mom has LC OP pamphlet, encouraged to call if has questions or f/u appt.  Monitor breast for transfer of milk.  No recent latch score, encouraged to call for latch before d/c if possible.   Patient Name: Megan Reese KayRuqayyah Ruzich ZOXWR'UToday's Date: 03/03/2017 Reason for consult: Follow-up assessment   Maternal Data Has patient been taught Hand Expression?: Yes Does the patient have breastfeeding experience prior to this delivery?: Yes  Feeding Feeding Type: Breast Fed Length of feed: 30 min  LATCH Score          Comfort (Breast/Nipple): Soft / non-tender        Interventions Interventions: Breast feeding basics reviewed  Lactation Tools Discussed/Used WIC Program: No   Consult Status Consult Status: Complete Date: 03/03/17    Charyl DancerCARVER, Allyse Fregeau G 03/03/2017, 9:32 AM

## 2017-06-28 NOTE — L&D Delivery Note (Signed)
Patient was C/C/+1 and pushed for 6 minutes withOUT epidural.    NSVD  female infant, Apgars 6,7, weight P.   The patient had a first degree midline laceration repaired with 2-0 vicryl. Fundus was firm. EBL was expected amount 250 cc.  Placenta had a marginal insertion of cord which separated from placenta with gentle traction. Cytotec 600 mcg placed anally and I waited for about 20 minutes for spontaneous delivery. Anesthesia was called and placed a low spinal for pain control. The placenta was then removed manually in pieces; the uterus was checked and found to be clear after last piece removed. Pt was comfortable during procedure.   Ancef 2 gm was given for prophylaxis secondary a large amount of internal manipulation. Bleeding was well controlled and fundus was again firm post removal.  Vagina was clear.  Delayed cord clamping done for 30-60 seconds while warming baby.  Baby was vigorous and doing skin to skin with mother for a few minutes after birth but then was not pinking up.  Baby was moved to warmer and had low sats and O2 was administered.  Neonatology was called and baby continued to have grunting and retracting.  Baby then transferred to NICU for O2 admin and workup for TTN in premature setting.   Vitor Overbaugh A

## 2018-01-10 ENCOUNTER — Inpatient Hospital Stay (HOSPITAL_COMMUNITY)
Admission: AD | Admit: 2018-01-10 | Discharge: 2018-01-12 | DRG: 805 | Disposition: A | Discharge status: Home / Self Care | Payer: BLUE CROSS/BLUE SHIELD | Attending: Obstetrics and Gynecology | Admitting: Obstetrics and Gynecology

## 2018-01-10 ENCOUNTER — Inpatient Hospital Stay (HOSPITAL_COMMUNITY): Payer: BLUE CROSS/BLUE SHIELD | Admitting: Anesthesiology

## 2018-01-10 ENCOUNTER — Other Ambulatory Visit: Payer: Self-pay

## 2018-01-10 ENCOUNTER — Encounter (HOSPITAL_COMMUNITY): Payer: Self-pay

## 2018-01-10 DIAGNOSIS — Z3A36 36 weeks gestation of pregnancy: Secondary | ICD-10-CM

## 2018-01-10 DIAGNOSIS — Z87891 Personal history of nicotine dependence: Secondary | ICD-10-CM | POA: Diagnosis not present

## 2018-01-10 DIAGNOSIS — O43123 Velamentous insertion of umbilical cord, third trimester: Secondary | ICD-10-CM | POA: Diagnosis present

## 2018-01-10 LAB — CBC
HCT: 38.4 % (ref 36.0–46.0)
HEMOGLOBIN: 13.4 g/dL (ref 12.0–15.0)
MCH: 30 pg (ref 26.0–34.0)
MCHC: 34.9 g/dL (ref 30.0–36.0)
MCV: 86.1 fL (ref 78.0–100.0)
PLATELETS: 184 10*3/uL (ref 150–400)
RBC: 4.46 MIL/uL (ref 3.87–5.11)
RDW: 13.5 % (ref 11.5–15.5)
WBC: 11.3 10*3/uL — ABNORMAL HIGH (ref 4.0–10.5)

## 2018-01-10 LAB — TYPE AND SCREEN
ABO/RH(D): A POS
ANTIBODY SCREEN: NEGATIVE

## 2018-01-10 MED ORDER — DIPHENHYDRAMINE HCL 50 MG/ML IJ SOLN: 12.5000 mg | INTRAMUSCULAR | Status: DC | PRN

## 2018-01-10 MED ORDER — FENTANYL 2.5 MCG/ML BUPIVACAINE 1/10 % EPIDURAL INFUSION (WH - ANES): 14.0000 mL/h | INTRAMUSCULAR | Status: DC | PRN

## 2018-01-10 MED ORDER — BUPIVACAINE IN DEXTROSE 0.75-8.25 % IT SOLN
INTRATHECAL | Status: DC | PRN
  Administered 2018-01-10: 1.4 mL via INTRATHECAL

## 2018-01-10 MED ORDER — LACTATED RINGERS IV SOLN: 500.0000 mL | INTRAVENOUS | Status: DC | PRN

## 2018-01-10 MED ORDER — ONDANSETRON HCL 4 MG/2ML IJ SOLN
4.0000 mg | Freq: Four times a day (QID) | INTRAMUSCULAR | Status: DC | PRN
  Administered 2018-01-10: 4 mg via INTRAVENOUS
  Filled 2018-01-10: qty 2

## 2018-01-10 MED ORDER — LACTATED RINGERS IV SOLN
INTRAVENOUS | Status: DC
  Administered 2018-01-10 (×2): via INTRAVENOUS

## 2018-01-10 MED ORDER — PHENYLEPHRINE 40 MCG/ML (10ML) SYRINGE FOR IV PUSH (FOR BLOOD PRESSURE SUPPORT)
PREFILLED_SYRINGE | INTRAVENOUS | Status: AC
  Filled 2018-01-10: qty 20

## 2018-01-10 MED ORDER — CEFAZOLIN SODIUM-DEXTROSE 2-4 GM/100ML-% IV SOLN: 2.0000 g | Freq: Once | INTRAVENOUS | Status: DC

## 2018-01-10 MED ORDER — EPHEDRINE 5 MG/ML INJ
10.0000 mg | INTRAVENOUS | Status: DC | PRN
  Filled 2018-01-10: qty 2

## 2018-01-10 MED ORDER — PHENYLEPHRINE 40 MCG/ML (10ML) SYRINGE FOR IV PUSH (FOR BLOOD PRESSURE SUPPORT)
80.0000 ug | PREFILLED_SYRINGE | INTRAVENOUS | Status: DC | PRN
  Filled 2018-01-10: qty 5

## 2018-01-10 MED ORDER — OXYTOCIN 40 UNITS IN LACTATED RINGERS INFUSION - SIMPLE MED
2.5000 [IU]/h | INTRAVENOUS | Status: DC
  Filled 2018-01-10: qty 1000

## 2018-01-10 MED ORDER — METHYLERGONOVINE MALEATE 0.2 MG/ML IJ SOLN
0.2000 mg | Freq: Once | INTRAMUSCULAR | Status: AC
  Administered 2018-01-10: 0.2 mg via INTRAMUSCULAR

## 2018-01-10 MED ORDER — FLEET ENEMA 7-19 GM/118ML RE ENEM: 1.0000 | ENEMA | RECTAL | Status: DC | PRN

## 2018-01-10 MED ORDER — BETAMETHASONE SOD PHOS & ACET 6 (3-3) MG/ML IJ SUSP
12.0000 mg | Freq: Once | INTRAMUSCULAR | Status: AC
  Administered 2018-01-10: 12 mg via INTRAMUSCULAR
  Filled 2018-01-10: qty 2

## 2018-01-10 MED ORDER — FENTANYL 2.5 MCG/ML BUPIVACAINE 1/10 % EPIDURAL INFUSION (WH - ANES)
INTRAMUSCULAR | Status: AC
  Filled 2018-01-10: qty 100

## 2018-01-10 MED ORDER — CEFAZOLIN SODIUM-DEXTROSE 2-4 GM/100ML-% IV SOLN
2.0000 g | Freq: Once | INTRAVENOUS | Status: AC
  Administered 2018-01-10: 2 g via INTRAVENOUS
  Filled 2018-01-10: qty 100

## 2018-01-10 MED ORDER — OXYTOCIN BOLUS FROM INFUSION
500.0000 mL | Freq: Once | INTRAVENOUS | Status: AC
  Administered 2018-01-10: 500 mL via INTRAVENOUS

## 2018-01-10 MED ORDER — MISOPROSTOL 200 MCG PO TABS
600.0000 ug | ORAL_TABLET | Freq: Once | ORAL | Status: AC
  Administered 2018-01-10: 600 ug via RECTAL

## 2018-01-10 MED ORDER — SOD CITRATE-CITRIC ACID 500-334 MG/5ML PO SOLN: 30.0000 mL | ORAL | Status: DC | PRN

## 2018-01-10 MED ORDER — LIDOCAINE HCL (PF) 1 % IJ SOLN
30.0000 mL | INTRAMUSCULAR | Status: DC | PRN
  Administered 2018-01-10: 30 mL via SUBCUTANEOUS
  Filled 2018-01-10: qty 30

## 2018-01-10 MED ORDER — ACETAMINOPHEN 325 MG PO TABS: 650.0000 mg | ORAL_TABLET | ORAL | Status: DC | PRN

## 2018-01-10 MED ORDER — MISOPROSTOL 200 MCG PO TABS
ORAL_TABLET | ORAL | Status: AC
  Filled 2018-01-10: qty 3

## 2018-01-10 MED ORDER — METHYLERGONOVINE MALEATE 0.2 MG/ML IJ SOLN
INTRAMUSCULAR | Status: AC
  Filled 2018-01-10: qty 1

## 2018-01-10 MED ORDER — LACTATED RINGERS IV SOLN
500.0000 mL | Freq: Once | INTRAVENOUS | Status: AC
  Administered 2018-01-10: 500 mL via INTRAVENOUS

## 2018-01-10 NOTE — Anesthesia Preprocedure Evaluation (Signed)
Anesthesia Evaluation  Patient identified by MRN, date of birth, ID band Patient awake    Reviewed: Allergy & Precautions, H&P , NPO status , Patient's Chart, lab work & pertinent test results, reviewed documented beta blocker date and time   Airway Mallampati: I  TM Distance: >3 FB Neck ROM: full    Dental no notable dental hx.    Pulmonary neg pulmonary ROS, former smoker,    Pulmonary exam normal breath sounds clear to auscultation       Cardiovascular negative cardio ROS Normal cardiovascular exam Rhythm:regular Rate:Normal     Neuro/Psych negative neurological ROS  negative psych ROS   GI/Hepatic negative GI ROS, Neg liver ROS,   Endo/Other  negative endocrine ROS  Renal/GU negative Renal ROS  negative genitourinary   Musculoskeletal   Abdominal   Peds  Hematology negative hematology ROS (+)   Anesthesia Other Findings   Reproductive/Obstetrics (+) Pregnancy                             Anesthesia Physical Anesthesia Plan  ASA: III  Anesthesia Plan: Spinal   Post-op Pain Management:    Induction:   PONV Risk Score and Plan:   Airway Management Planned:   Additional Equipment:   Intra-op Plan:   Post-operative Plan:   Informed Consent: I have reviewed the patients History and Physical, chart, labs and discussed the procedure including the risks, benefits and alternatives for the proposed anesthesia with the patient or authorized representative who has indicated his/her understanding and acceptance.     Plan Discussed with: CRNA, Anesthesiologist and Surgeon  Anesthesia Plan Comments: (Retained placenta  )        Anesthesia Quick Evaluation

## 2018-01-10 NOTE — MAU Note (Signed)
Pt presents to mau via EMS in active labor. Cervical exam 7/80/-1   Expedited transfer to L&D

## 2018-01-10 NOTE — Anesthesia Procedure Notes (Signed)
Spinal  Patient location during procedure: OR Start time: 01/10/2018 9:20 PM End time: 01/10/2018 9:25 PM Staffing Anesthesiologist: Bethena Midgetddono, Cai Flott, MD Preanesthetic Checklist Completed: patient identified, site marked, surgical consent, pre-op evaluation, timeout performed, IV checked, risks and benefits discussed and monitors and equipment checked Spinal Block Patient position: sitting Prep: DuraPrep Patient monitoring: heart rate, cardiac monitor, continuous pulse ox and blood pressure Approach: midline Location: L4-5 Injection technique: single-shot Needle Needle type: Sprotte  Needle gauge: 24 G Needle length: 9 cm Assessment Sensory level: T4

## 2018-01-10 NOTE — H&P (Signed)
32 y.o. 6477w1d  G4P2002 comes in c/o labor preterm.  Otherwise has good fetal movement and no bleeding.  Past Medical History:  Diagnosis Date  . ADHD (attention deficit hyperactivity disorder)   . Depression   . Headache    History reviewed. No pertinent surgical history.  OB History  Gravida Para Term Preterm AB Living  4 2 2     2   SAB TAB Ectopic Multiple Live Births        0 2    # Outcome Date GA Lbr Len/2nd Weight Sex Delivery Anes PTL Lv  4 Current           3 Term 03/01/17 3564w2d / 00:15 8 lb 3.9 oz (3.739 kg) M Vag-Spont EPI  LIV  2 Term 09/24/13 6875w2d 10:30 / 01:39 8 lb 7.8 oz (3.85 kg) M Vag-Spont EPI  LIV     Birth Comments: none  1 Gravida             Social History   Socioeconomic History  . Marital status: Married    Spouse name: Not on file  . Number of children: Not on file  . Years of education: Not on file  . Highest education level: Not on file  Occupational History  . Not on file  Social Needs  . Financial resource strain: Not on file  . Food insecurity:    Worry: Not on file    Inability: Not on file  . Transportation needs:    Medical: Not on file    Non-medical: Not on file  Tobacco Use  . Smoking status: Former Smoker    Last attempt to quit: 04/03/2013    Years since quitting: 4.7  . Smokeless tobacco: Never Used  Substance and Sexual Activity  . Alcohol use: No  . Drug use: No  . Sexual activity: Not Currently    Birth control/protection: None  Lifestyle  . Physical activity:    Days per week: Not on file    Minutes per session: Not on file  . Stress: Not on file  Relationships  . Social connections:    Talks on phone: Not on file    Gets together: Not on file    Attends religious service: Not on file    Active member of club or organization: Not on file    Attends meetings of clubs or organizations: Not on file    Relationship status: Not on file  . Intimate partner violence:    Fear of current or ex partner: Not on file   Emotionally abused: Not on file    Physically abused: Not on file    Forced sexual activity: Not on file  Other Topics Concern  . Not on file  Social History Narrative  . Not on file   Hydrocodone    Prenatal Transfer Tool  Maternal Diabetes: No Genetic Screening: Normal Maternal Ultrasounds/Referrals: Normal Fetal Ultrasounds or other Referrals:  None Maternal Substance Abuse:  No Significant Maternal Medications:  None Significant Maternal Lab Results: None  Other PNC: uncomplicated.    Vitals:   01/10/18 1958  BP: 126/90  Pulse: 86  Temp: 97.7 F (36.5 C)  TempSrc: Oral    Lungs/Cor:  NAD Abdomen:  soft, gravid Ex:  no cords, erythema SVE:  7/80/-2 FHTs:  130, good STV, NST R Toco:  q 3 minutes   A/P   Preterm labor, BMZ given right now.  GBS neg.     Megan Reese A

## 2018-01-11 ENCOUNTER — Encounter (HOSPITAL_COMMUNITY): Payer: Self-pay

## 2018-01-11 LAB — CBC
HCT: 39 % (ref 36.0–46.0)
HEMOGLOBIN: 13.5 g/dL (ref 12.0–15.0)
MCH: 29.9 pg (ref 26.0–34.0)
MCHC: 34.6 g/dL (ref 30.0–36.0)
MCV: 86.3 fL (ref 78.0–100.0)
Platelets: 191 10*3/uL (ref 150–400)
RBC: 4.52 MIL/uL (ref 3.87–5.11)
RDW: 13.4 % (ref 11.5–15.5)
WBC: 15.3 10*3/uL — ABNORMAL HIGH (ref 4.0–10.5)

## 2018-01-11 LAB — RPR: RPR Ser Ql: NONREACTIVE

## 2018-01-11 MED ORDER — SIMETHICONE 80 MG PO CHEW: 80.0000 mg | CHEWABLE_TABLET | ORAL | Status: DC | PRN

## 2018-01-11 MED ORDER — SODIUM CHLORIDE 0.9% FLUSH: 3.0000 mL | Freq: Two times a day (BID) | INTRAVENOUS | Status: DC

## 2018-01-11 MED ORDER — METHYLERGONOVINE MALEATE 0.2 MG PO TABS: 0.2000 mg | ORAL_TABLET | ORAL | Status: DC | PRN

## 2018-01-11 MED ORDER — MAGNESIUM HYDROXIDE 400 MG/5ML PO SUSP: 30.0000 mL | ORAL | Status: DC | PRN

## 2018-01-11 MED ORDER — SODIUM CHLORIDE 0.9 % IV SOLN: 250.0000 mL | INTRAVENOUS | Status: DC | PRN

## 2018-01-11 MED ORDER — OXYCODONE-ACETAMINOPHEN 5-325 MG PO TABS
2.0000 | ORAL_TABLET | ORAL | Status: DC | PRN
  Administered 2018-01-11: 2 via ORAL
  Filled 2018-01-11: qty 2

## 2018-01-11 MED ORDER — DIPHENHYDRAMINE HCL 25 MG PO CAPS: 25.0000 mg | ORAL_CAPSULE | Freq: Four times a day (QID) | ORAL | Status: DC | PRN

## 2018-01-11 MED ORDER — ACETAMINOPHEN 325 MG PO TABS: 650.0000 mg | ORAL_TABLET | ORAL | Status: DC | PRN

## 2018-01-11 MED ORDER — METHYLERGONOVINE MALEATE 0.2 MG/ML IJ SOLN: 0.2000 mg | INTRAMUSCULAR | Status: DC | PRN

## 2018-01-11 MED ORDER — FERROUS SULFATE 325 (65 FE) MG PO TABS
325.0000 mg | ORAL_TABLET | Freq: Two times a day (BID) | ORAL | Status: DC
  Administered 2018-01-11 – 2018-01-12 (×3): 325 mg via ORAL
  Filled 2018-01-11 (×3): qty 1

## 2018-01-11 MED ORDER — ONDANSETRON HCL 4 MG PO TABS: 4.0000 mg | ORAL_TABLET | ORAL | Status: DC | PRN

## 2018-01-11 MED ORDER — PRENATAL MULTIVITAMIN CH
1.0000 | ORAL_TABLET | Freq: Every day | ORAL | Status: DC
  Administered 2018-01-11 – 2018-01-12 (×2): 1 via ORAL
  Filled 2018-01-11 (×2): qty 1

## 2018-01-11 MED ORDER — DIBUCAINE 1 % RE OINT: 1.0000 "application " | TOPICAL_OINTMENT | RECTAL | Status: DC | PRN

## 2018-01-11 MED ORDER — COCONUT OIL OIL: 1.0000 "application " | TOPICAL_OIL | Status: DC | PRN

## 2018-01-11 MED ORDER — TETANUS-DIPHTH-ACELL PERTUSSIS 5-2.5-18.5 LF-MCG/0.5 IM SUSP: 0.5000 mL | Freq: Once | INTRAMUSCULAR | Status: DC

## 2018-01-11 MED ORDER — WITCH HAZEL-GLYCERIN EX PADS: 1.0000 "application " | MEDICATED_PAD | CUTANEOUS | Status: DC | PRN

## 2018-01-11 MED ORDER — IBUPROFEN 800 MG PO TABS
800.0000 mg | ORAL_TABLET | Freq: Three times a day (TID) | ORAL | Status: DC
  Administered 2018-01-11 – 2018-01-12 (×4): 800 mg via ORAL
  Filled 2018-01-11 (×4): qty 1

## 2018-01-11 MED ORDER — BENZOCAINE-MENTHOL 20-0.5 % EX AERO
1.0000 "application " | INHALATION_SPRAY | CUTANEOUS | Status: DC | PRN
  Administered 2018-01-11: 1 via TOPICAL
  Filled 2018-01-11: qty 56

## 2018-01-11 MED ORDER — SENNOSIDES-DOCUSATE SODIUM 8.6-50 MG PO TABS
2.0000 | ORAL_TABLET | ORAL | Status: DC
  Administered 2018-01-11: 2 via ORAL
  Filled 2018-01-11: qty 2

## 2018-01-11 MED ORDER — ZOLPIDEM TARTRATE 5 MG PO TABS: 5.0000 mg | ORAL_TABLET | Freq: Every evening | ORAL | Status: DC | PRN

## 2018-01-11 MED ORDER — ONDANSETRON HCL 4 MG/2ML IJ SOLN: 4.0000 mg | INTRAMUSCULAR | Status: DC | PRN

## 2018-01-11 MED ORDER — MEASLES, MUMPS & RUBELLA VAC ~~LOC~~ INJ
0.5000 mL | INJECTION | Freq: Once | SUBCUTANEOUS | Status: DC
  Filled 2018-01-11: qty 0.5

## 2018-01-11 MED ORDER — SODIUM CHLORIDE 0.9% FLUSH: 3.0000 mL | INTRAVENOUS | Status: DC | PRN

## 2018-01-11 NOTE — Anesthesia Postprocedure Evaluation (Signed)
Anesthesia Post Note  Patient: Megan Reese  Procedure(s) Performed: AN AD HOC LABOR EPIDURAL     Patient location during evaluation: L&D Anesthesia Type: Spinal Level of consciousness: oriented and awake and alert Pain management: pain level controlled Vital Signs Assessment: post-procedure vital signs reviewed and stable Respiratory status: spontaneous breathing, respiratory function stable and patient connected to nasal cannula oxygen Cardiovascular status: blood pressure returned to baseline and stable Postop Assessment: no headache, no backache and no apparent nausea or vomiting Anesthetic complications: no    Last Vitals:  Vitals:   01/10/18 2225 01/10/18 2300  BP: 113/76   Pulse: 96   Resp: 16   Temp:  36.6 C  SpO2: 100%     Last Pain:  Vitals:   01/10/18 2300  TempSrc: Oral  PainSc:    Pain Goal:                 Theodoro Koval

## 2018-01-11 NOTE — Addendum Note (Signed)
Addendum  created 01/11/18 0950 by Margaretta Chittum, Doree Fudgeolleen S, CRNA   Charge Capture section accepted, Sign clinical note

## 2018-01-11 NOTE — Progress Notes (Signed)
MOB was referred for history of depression/anxiety. * Referral screened out by Clinical Social Worker because none of the following criteria appear to apply: ~ History of anxiety/depression during this pregnancy, or of post-partum depression. ~ Diagnosis of anxiety and/or depression within last 3 years OR * MOB's symptoms currently being treated with medication and/or therapy. Please contact the Clinical Social Worker if needs arise, by MOB request, or if MOB scores greater than 9/yes to question 10 on [place] Postpartum Depression Screen.  Shyhiem Beeney Boyd-Gilyard, MSW, LCSW Clinical Social Work [phone] 

## 2018-01-11 NOTE — Anesthesia Postprocedure Evaluation (Signed)
Anesthesia Post Note  Patient: Megan Reese  Procedure(s) Performed: AN AD HOC LABOR EPIDURAL     Patient location during evaluation: Mother Baby Anesthesia Type: Epidural Level of consciousness: awake, awake and alert and oriented Pain management: pain level controlled Vital Signs Assessment: post-procedure vital signs reviewed and stable Respiratory status: spontaneous breathing, nonlabored ventilation and respiratory function stable Cardiovascular status: stable Postop Assessment: no headache, no backache, patient able to bend at knees, no apparent nausea or vomiting, adequate PO intake and able to ambulate Anesthetic complications: no    Last Vitals:  Vitals:   01/11/18 0050 01/11/18 0628  BP: 140/87 111/83  Pulse: 90 90  Resp: 18 18  Temp: 37.1 C 36.8 C  SpO2:      Last Pain:  Vitals:   01/11/18 0628  TempSrc: Oral  PainSc:    Pain Goal:                 Zuly Belkin

## 2018-01-11 NOTE — Progress Notes (Signed)
Patient is eating, ambulating, voiding.  Pain control is good.  Appropriate lochia, no complaints.  Vitals:   01/10/18 2300 01/10/18 2348 01/11/18 0050 01/11/18 0628  BP:  121/87 140/87 111/83  Pulse:  90 90 90  Resp:  18 18 18   Temp: 97.9 F (36.6 C) 98.2 F (36.8 C) 98.7 F (37.1 C) 98.2 F (36.8 C)  TempSrc: Oral Oral Oral Oral  SpO2:      Weight:      Height:        Fundus firm, non tender Ext: no calf tenderness  Lab Results  Component Value Date   WBC 15.3 (H) 01/11/2018   HGB 13.5 01/11/2018   HCT 39.0 01/11/2018   MCV 86.3 01/11/2018   PLT 191 01/11/2018    --/--/A POS (07/16 2015)  A/P Post partum day 1 Circ desired and consent obtained, nursery informed me that baby has not been cleared by MD yet. S/p abx for manual extraction of placenta- no evidence of endometritis. Doing well  Routine care.   Philip AspenALLAHAN, Mairim Bade

## 2018-01-11 NOTE — Lactation Note (Signed)
This note was copied from a baby's chart. Lactation Consultation Note  Patient Name: Megan Reese: 01/11/2018 Reason for consult: Initial assessment;Late-preterm 34-36.6wks  P3 mother whose infant is now 868 hours old.  This is a LPTI at 36+1 weeks and weighing 7 +3.7 oz.  Baby was in NICU for observation and just recently brought to mother.  Mother has a 7610 month old and 31 year old who she breastfed.  Mother's breasts are soft and non tender with short shafted nipples.    Offered my assistance with latch and mother accepted.  Attempted to latch onto the left breast in the football hold.  Baby was too sleepy and put forth no effort to latch and suck at this time.  Encouraged mother to do a lot of STS and to watch for feeding cues which he may or may not show due to his gestational age.  Reviewed the LPTI policy in depth and mother had no further questions/concerns about supplementing.  She demonstrated hand expression and was only able to express a drop.    RN had the DEBP set up so I got mother pumping and explained to pump every 3 hours after attempting breastfeeding. Mother will limit total feeding time to no longer than 30 minutes.   Manual pump provided at mother's request.  Reviewed pump parts, assembly, disassembly and cleaning of both pumps.  Colostrum container provided for any EBM mother may obtain during hand expression.  She will feed back any EBM she obtains to baby.  If mother has to supplement with formula she prefers a formula similar to Allimentum for reflux.  I informed her RN about this request.  Mother will call for assistance as needed.  RN updated.   Maternal Data Formula Feeding for Exclusion: No Has patient been taught Hand Expression?: Yes Does the patient have breastfeeding experience prior to this delivery?: Yes  Feeding Feeding Type: Breast Fed Length of feed: 0 min  LATCH Score Latch: Too sleepy or reluctant, no latch achieved, no sucking  elicited.  Audible Swallowing: None  Type of Nipple: Everted at rest and after stimulation(short shafted bilaterally)  Comfort (Breast/Nipple): Soft / non-tender  Hold (Positioning): Assistance needed to correctly position infant at breast and maintain latch.  LATCH Score: 5  Interventions Interventions: Breast feeding basics reviewed;Assisted with latch;Skin to skin;Breast massage;Hand express;Pre-pump if needed;Position options;Support pillows;Adjust position;Breast compression;DEBP;Hand pump  Lactation Tools Discussed/Used     Consult Status Consult Status: Follow-up Reese: 01/12/18 Follow-up type: In-patient    [name] [name] 01/11/2018, 5:43 AM

## 2018-01-12 ENCOUNTER — Ambulatory Visit: Payer: Self-pay

## 2018-01-12 LAB — BIRTH TISSUE RECOVERY COLLECTION (PLACENTA DONATION)

## 2018-01-12 MED ORDER — IBUPROFEN 800 MG PO TABS: 800.0000 mg | ORAL_TABLET | Freq: Three times a day (TID) | ORAL | 0 refills | Status: AC

## 2018-01-12 NOTE — Lactation Note (Signed)
This note was copied from a baby's chart. Lactation Consultation Note  Patient Name: Megan Reese Reason for consult: Follow-up assessment;Late-preterm 34-36.6wks;Infant weight loss  Mom w/ prior BF experience  BF previous  child for 4 months  P3,, late-term infant , weight loss of -7%. Per mom, BF infant greater than 1 hour within a feeding .  LC encouraged mom   BF no longer than 30 mins per feeding due infant being late pre-term infant. LC enter room mom was breastfeeding in cradle hold. Mom nipple is evert, no trauma and mom hand expressed  0.465ml colostrum which she finger feed to infant. Mom will hand express before each  feeding, BF pre-term infant then supplement with formula . Feeding amounts chart  given  mom gave 10 ml after BF infant.  Mom will increase amount due infant age at  24-48 hrs w/ BF (7-7312ml).  Mom encouraged to feed baby 8-12 times/24 hours and with feeding cues. Mom will pump after each feeding q3h for 15-20 mins for breast stimulation and induction.  Mom has medela DEBP at home.  Discussed late-preterm behaviors.  Discussed engorgement treatment and prevention. Mom made aware of O/P services, breastfeeding support groups, community resources, and our phone # for post-discharge questions.  [facility]  Maternal Data Does the patient have breastfeeding experience prior to this delivery?: Yes  Feeding Feeding Type: Breast Fed Length of feed: 15 min  LATCH Score Latch: Repeated attempts needed to sustain latch, nipple held in mouth throughout feeding, stimulation needed to elicit sucking reflex.  Audible Swallowing: Spontaneous and intermittent  Type of Nipple: Everted at rest and after stimulation  Comfort (Breast/Nipple): Soft / non-tender  Hold (Positioning): No assistance needed to correctly position infant at breast.  LATCH Score: 9  Interventions Interventions: Skin to  skin;Breast massage;Hand express;DEBP  Lactation Tools Discussed/Used Tools: Pump Breast pump type: Double-Electric Breast Pump   Consult Status Consult Status: Follow-up Date: 01/13/18 Follow-up type: Out-patient    Megan Reese Reese, 9:09 AM

## 2018-01-12 NOTE — Discharge Summary (Signed)
Obstetric Discharge Summary Reason for Admission: onset of labor Prenatal Procedures: none Intrapartum Procedures: spontaneous vaginal delivery Postpartum Procedures: none Complications-Operative and Postpartum: 1 degree perineal laceration and manual extraction of placenta after cord avulsion Hemoglobin  Date Value Ref Range Status  01/11/2018 13.5 12.0 - 15.0 g/dL Final   HCT  Date Value Ref Range Status  01/11/2018 39.0 36.0 - 46.0 % Final    Physical Exam:  General: alert, cooperative and appears stated age 16Lochia: appropriate Uterine Fundus: firm DVT Evaluation: No evidence of DVT seen on physical exam.  Discharge Diagnoses: Premature labor and delivery at 36 weeks  Discharge Information: Date: 01/12/2018 Activity: pelvic rest Diet: routine Medications: PNV and Ibuprofen Condition: stable Instructions: refer to practice specific booklet Discharge to: home Follow-up Information    Carrington ClampHorvath, Michelle, MD Follow up in 4 week(s).   Specialty:  Obstetrics and Gynecology Contact information: [address] [place] [address] [phone]           Newborn Data: Live born female  Birth Weight: 7 lb 3.7 oz (3280 g) APGAR: 6, 7  Newborn Delivery   Birth date/time:  01/10/2018 20:55:00 Delivery type:  Vaginal, Spontaneous     Home with mother.  Adelfa Lozito GEFFEL Shawne Eskelson 01/12/2018, 9:34 AM

## 2018-01-12 NOTE — Lactation Note (Signed)
This note was copied from a baby's chart. Lactation Consultation Note  Patient Name: Boy Baird KayRuqayyah Boettger ZOXWR'UToday's Date: 01/12/2018    [facility]  P3 mother whose infant is now 5448 hours old.  Mother has breastfeeding experience with her 4410 month old and 31 year old.  This is a LPTI t 36+1 weeks and weighs 6 lbs+11.8 oz.  He is under phototherapy.  Reviewed plan of care for this evening and throughout the night.  Mother stated that she can latch baby easily onto left breast but not the right breast.  She also stated she felt like she was "engorged."  However, after assessing, she is not engorged.  She feels like she is full and filling.  Her breasts are soft and non tender.  Her nipples are short shafted but not flat.    I offered to assist with latch on the right side and mother accepted.  She wanted to feed in the cradle hold until I suggested trying the football hold.  Baby was awake and alert but not showing feeding cues.  He was able to open wide and latch but did not suck.  Mother demonstrated breast compressions during feeding and gentle stimulation but he still was not interested in sucking.  While trying to latch infant had another bowel movement.  This may have hindered him latching.  Mother will use the DEBP after giving him his supplementation.  Any EBM she obtains she will feed back to baby.  Reviewed again the LPTI feeding policy and informed mother that she can now increase the supplementation to 20-30 mls after every feeding.  She verbalized understanding of plan of care.   Mother will call for assistance tonight as needed.  RN updated.      Horace Lukas R Marqueze Ramcharan 01/12/2018, 9:26 PM

## 2018-01-12 NOTE — Progress Notes (Signed)
CSW acknowledges consult.  CSW attempted to meet with MOB, however MOB was asleep.  CSW will attempt to visit with MOB at a later time.   Samariya Rockhold Boyd-Gilyard, MSW, LCSW Clinical Social Work [phone]  

## 2018-01-12 NOTE — Progress Notes (Signed)
Patient is doing well.  She is ambulating, voiding, tolerating PO.  Pain control is good.  Lochia is appropriate  Vitals:   01/11/18 0628 01/11/18 1335 01/11/18 2155 01/12/18 0614  BP: 111/83 118/79 109/75 118/83  Pulse: 90 81 78 81  Resp: 18 16 18 18   Temp: 98.2 F (36.8 C) 98.3 F (36.8 C) 98.5 F (36.9 C) 97.6 F (36.4 C)  TempSrc: Oral Oral Oral Oral  SpO2:   97% 96%  Weight:      Height:        NAD Fundus firm Ext: 2+ edema b/l  Lab Results  Component Value Date   WBC 15.3 (H) 01/11/2018   HGB 13.5 01/11/2018   HCT 39.0 01/11/2018   MCV 86.3 01/11/2018   PLT 191 01/11/2018    --/--/A POS (07/16 2015)/RI  A/P 32 y.o. [id number] PPD#2. Routine care.   Meeting all goals, d/c to home today  Desires circumcision. Discussed r/b/a of the procedure. Reviewed that circumcision is an elective surgical procedure and not considered medically necessary. Reviewed the risks of the procedure including the risk of infection, bleeding, damage to surrounding structures, including scrotum, shaft, urethra and head of penis, and an undesired cosmetic effect requiring additional procedures for revision. Consent signed.    Trustpoint HospitalDYANNA Reese [employer] CompanyCLARK

## 2018-01-13 ENCOUNTER — Ambulatory Visit: Payer: Self-pay

## 2018-01-13 NOTE — Lactation Note (Addendum)
This note was copied from a baby's chart. Lactation Consultation Note;  infant is 8459 hours old and is 36.1 weeks gest. Infant is 10 % weight loss. Mother has been breastfeeding , supplementing infant with ebm and formula then  post pumping.  Mother reports that her breast are filling. Mother bottle feeding infant when I arrived in room. Mother had just given 6 ml of formula.   Assist mother with latching infant on the Rt breast. Infant sustained latch for 10 mins. Observed softening of breast. Infant latched on the alternate breast in football hold and sustained another 10 mins feeding. Mother taught application of NS.  Placed #20 nipple shield on and shield was pre-filled with formula. Infant took 15 ml of formula. Discussed importance of using good support with LPI .   Discussed pumping for at least 15-20 mins. Advised mother to continue to hand express before and after feeding and pumping.   Encouraged mother to continue to do frequent skin to skin.  Mother advised to continue to breastfeed infant before formula feeding , supplement infant and then pump.  Mother reports that she doesn't feel good suction from the left side of the pump. Pump turned on and observed good movement with pump parts and suction.   Mother receptive to all teaching. She was advised to follow up with staff nurse when ready to pump breast.        Patient Name: Boy Megan Reese [id number]'XToday's Date: 01/13/2018     Maternal Data    Feeding Feeding Type: Breast Milk  LATCH Score                   Interventions    Lactation Tools Discussed/Used     Consult Status      [name], [name] 01/13/2018, 2:31 PM

## 2018-01-17 NOTE — Progress Notes (Signed)
CSW received consult due hx of depression and PPD.  When CSW arrived, MOB was preparing for discharge.  MOB was polite and receptive to meeting with CSW.  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  MOB reported that MOB's symptoms have been managed with natural interventions.  MOB reported feeling good emotional and feeling prepared to parent. Per MOB, MOB has a strong support team and has all the essentials needed for infant.   CSW provided education regarding Baby Blues vs PMADs.  CSW encouraged MOB to evaluate her mental health throughout the postpartum period with the use of the New Mom Checklist developed by [facility]partum [employer] and notify a medical professional if symptoms arise.  CSW assessed for safety and MOB denied SI and HI.  MOB presented with insight and awareness and did not demonstrate any signs or symptoms of depression/anxiety.  There are no barriers to discharge.  Blaine HamperAngel Boyd-Gilyard, MSW, LCSW Clinical Social Work [phone]
